# Patient Record
Sex: Male | Born: 1950 | Race: White | Hispanic: No | Smoking: Current every day smoker
Health system: Southern US, Community
[De-identification: ages and names within clinical notes are randomized; demographics above are authoritative.]

## PROBLEM LIST (undated history)

## (undated) DIAGNOSIS — C801 Malignant (primary) neoplasm, unspecified: Secondary | ICD-10-CM

---

## 1997-12-21 ENCOUNTER — Encounter: Admission: RE | Admit: 1997-12-21 | Discharge: 1997-12-21 | Payer: Self-pay | Admitting: Internal Medicine

## 1997-12-22 ENCOUNTER — Encounter: Admission: RE | Admit: 1997-12-22 | Discharge: 1997-12-22 | Payer: Self-pay | Admitting: Internal Medicine

## 1997-12-29 ENCOUNTER — Encounter: Admission: RE | Admit: 1997-12-29 | Discharge: 1997-12-29 | Payer: Self-pay | Admitting: Hematology and Oncology

## 1998-01-23 ENCOUNTER — Ambulatory Visit (HOSPITAL_COMMUNITY): Admission: RE | Admit: 1998-01-23 | Discharge: 1998-01-23 | Payer: Self-pay | Admitting: *Deleted

## 2000-04-06 ENCOUNTER — Emergency Department (HOSPITAL_COMMUNITY): Admission: EM | Admit: 2000-04-06 | Discharge: 2000-04-06 | Payer: Self-pay | Admitting: Emergency Medicine

## 2000-04-06 ENCOUNTER — Encounter: Payer: Self-pay | Admitting: Emergency Medicine

## 2000-08-18 ENCOUNTER — Emergency Department (HOSPITAL_COMMUNITY): Admission: EM | Admit: 2000-08-18 | Discharge: 2000-08-18 | Payer: Self-pay | Admitting: Emergency Medicine

## 2002-07-06 ENCOUNTER — Emergency Department (HOSPITAL_COMMUNITY): Admission: EM | Admit: 2002-07-06 | Discharge: 2002-07-06 | Payer: Self-pay | Admitting: Emergency Medicine

## 2002-07-29 ENCOUNTER — Encounter: Admission: RE | Admit: 2002-07-29 | Discharge: 2002-07-29 | Payer: Self-pay | Admitting: Family Medicine

## 2002-07-29 ENCOUNTER — Encounter: Payer: Self-pay | Admitting: Family Medicine

## 2002-08-23 ENCOUNTER — Encounter: Admission: RE | Admit: 2002-08-23 | Discharge: 2002-08-23 | Payer: Self-pay | Admitting: Family Medicine

## 2002-08-31 ENCOUNTER — Encounter: Admission: RE | Admit: 2002-08-31 | Discharge: 2002-09-24 | Payer: Self-pay | Admitting: Family Medicine

## 2002-09-20 ENCOUNTER — Encounter: Admission: RE | Admit: 2002-09-20 | Discharge: 2002-09-20 | Payer: Self-pay | Admitting: Family Medicine

## 2002-10-08 ENCOUNTER — Encounter: Payer: Self-pay | Admitting: Orthopedic Surgery

## 2002-10-12 ENCOUNTER — Ambulatory Visit (HOSPITAL_COMMUNITY): Admission: RE | Admit: 2002-10-12 | Discharge: 2002-10-12 | Payer: Self-pay | Admitting: Orthopedic Surgery

## 2002-10-12 ENCOUNTER — Encounter: Payer: Self-pay | Admitting: Orthopedic Surgery

## 2002-10-15 ENCOUNTER — Encounter: Admission: RE | Admit: 2002-10-15 | Discharge: 2002-12-03 | Payer: Self-pay | Admitting: Orthopedic Surgery

## 2003-04-22 ENCOUNTER — Encounter: Admission: RE | Admit: 2003-04-22 | Discharge: 2003-04-22 | Payer: Self-pay | Admitting: Family Medicine

## 2003-09-04 ENCOUNTER — Inpatient Hospital Stay (HOSPITAL_COMMUNITY): Admission: AD | Admit: 2003-09-04 | Discharge: 2003-09-07 | Payer: Self-pay | Admitting: Family Medicine

## 2003-12-31 ENCOUNTER — Emergency Department (HOSPITAL_COMMUNITY): Admission: AD | Admit: 2003-12-31 | Discharge: 2003-12-31 | Payer: Self-pay | Admitting: *Deleted

## 2004-01-24 ENCOUNTER — Encounter: Admission: RE | Admit: 2004-01-24 | Discharge: 2004-01-24 | Payer: Self-pay | Admitting: Family Medicine

## 2004-01-25 ENCOUNTER — Encounter: Admission: RE | Admit: 2004-01-25 | Discharge: 2004-01-25 | Payer: Self-pay | Admitting: Sports Medicine

## 2004-01-26 ENCOUNTER — Encounter: Admission: RE | Admit: 2004-01-26 | Discharge: 2004-01-26 | Payer: Self-pay | Admitting: Family Medicine

## 2004-01-30 ENCOUNTER — Encounter: Admission: RE | Admit: 2004-01-30 | Discharge: 2004-01-30 | Payer: Self-pay | Admitting: Family Medicine

## 2004-02-01 ENCOUNTER — Ambulatory Visit (HOSPITAL_COMMUNITY): Admission: RE | Admit: 2004-02-01 | Discharge: 2004-02-01 | Payer: Self-pay | Admitting: Urology

## 2004-02-06 ENCOUNTER — Emergency Department (HOSPITAL_COMMUNITY): Admission: AC | Admit: 2004-02-06 | Discharge: 2004-02-07 | Payer: Self-pay

## 2004-02-21 ENCOUNTER — Encounter (INDEPENDENT_AMBULATORY_CARE_PROVIDER_SITE_OTHER): Payer: Self-pay | Admitting: *Deleted

## 2004-02-21 ENCOUNTER — Inpatient Hospital Stay (HOSPITAL_COMMUNITY): Admission: RE | Admit: 2004-02-21 | Discharge: 2004-02-24 | Payer: Self-pay | Admitting: Urology

## 2004-04-02 ENCOUNTER — Ambulatory Visit: Payer: Self-pay | Admitting: Family Medicine

## 2004-04-10 ENCOUNTER — Ambulatory Visit: Payer: Self-pay | Admitting: Family Medicine

## 2004-04-30 ENCOUNTER — Ambulatory Visit: Payer: Self-pay | Admitting: Family Medicine

## 2004-05-18 ENCOUNTER — Encounter: Admission: RE | Admit: 2004-05-18 | Discharge: 2004-05-18 | Payer: Self-pay | Admitting: Urology

## 2004-06-28 ENCOUNTER — Emergency Department (HOSPITAL_COMMUNITY): Admission: EM | Admit: 2004-06-28 | Discharge: 2004-06-28 | Payer: Self-pay | Admitting: Family Medicine

## 2004-08-02 ENCOUNTER — Emergency Department (HOSPITAL_COMMUNITY): Admission: EM | Admit: 2004-08-02 | Discharge: 2004-08-02 | Payer: Self-pay

## 2004-08-06 ENCOUNTER — Ambulatory Visit: Payer: Self-pay | Admitting: Family Medicine

## 2004-08-07 ENCOUNTER — Encounter: Admission: RE | Admit: 2004-08-07 | Discharge: 2004-08-07 | Payer: Self-pay | Admitting: Family Medicine

## 2004-08-10 ENCOUNTER — Ambulatory Visit: Payer: Self-pay | Admitting: Family Medicine

## 2004-11-19 ENCOUNTER — Ambulatory Visit (HOSPITAL_COMMUNITY): Admission: RE | Admit: 2004-11-19 | Discharge: 2004-11-19 | Payer: Self-pay | Admitting: Urology

## 2004-11-22 ENCOUNTER — Ambulatory Visit (HOSPITAL_COMMUNITY): Admission: RE | Admit: 2004-11-22 | Discharge: 2004-11-22 | Payer: Self-pay | Admitting: Urology

## 2004-12-14 ENCOUNTER — Emergency Department (HOSPITAL_COMMUNITY): Admission: EM | Admit: 2004-12-14 | Discharge: 2004-12-14 | Payer: Self-pay | Admitting: Family Medicine

## 2005-01-14 ENCOUNTER — Encounter (INDEPENDENT_AMBULATORY_CARE_PROVIDER_SITE_OTHER): Payer: Self-pay | Admitting: *Deleted

## 2005-01-14 ENCOUNTER — Ambulatory Visit (HOSPITAL_COMMUNITY): Admission: RE | Admit: 2005-01-14 | Discharge: 2005-01-15 | Payer: Self-pay | Admitting: General Surgery

## 2005-02-27 IMAGING — CR DG CHEST 2V
2 series · 2 of 2 positions shown · non-contrast
Comparison: none

CLINICAL DATA: History of renal cell cancer.  Smoker. Cough.
 CHEST, TWO VIEWS ? 05/18/04:
 Since [REDACTED] two view chest x-ray of 01/30/04, probable prominent nipple shadows are seen on PA view.  Repeat PA view with nipple shadows is advised for further evaluation.  Stable surgical sutures are seen at the medial right lung apex with the lungs otherwise clear.  Heart size is normal.  Mediastinum, hila, pleura, and osseous structures are stable.

[view not recorded (1 of 2)]
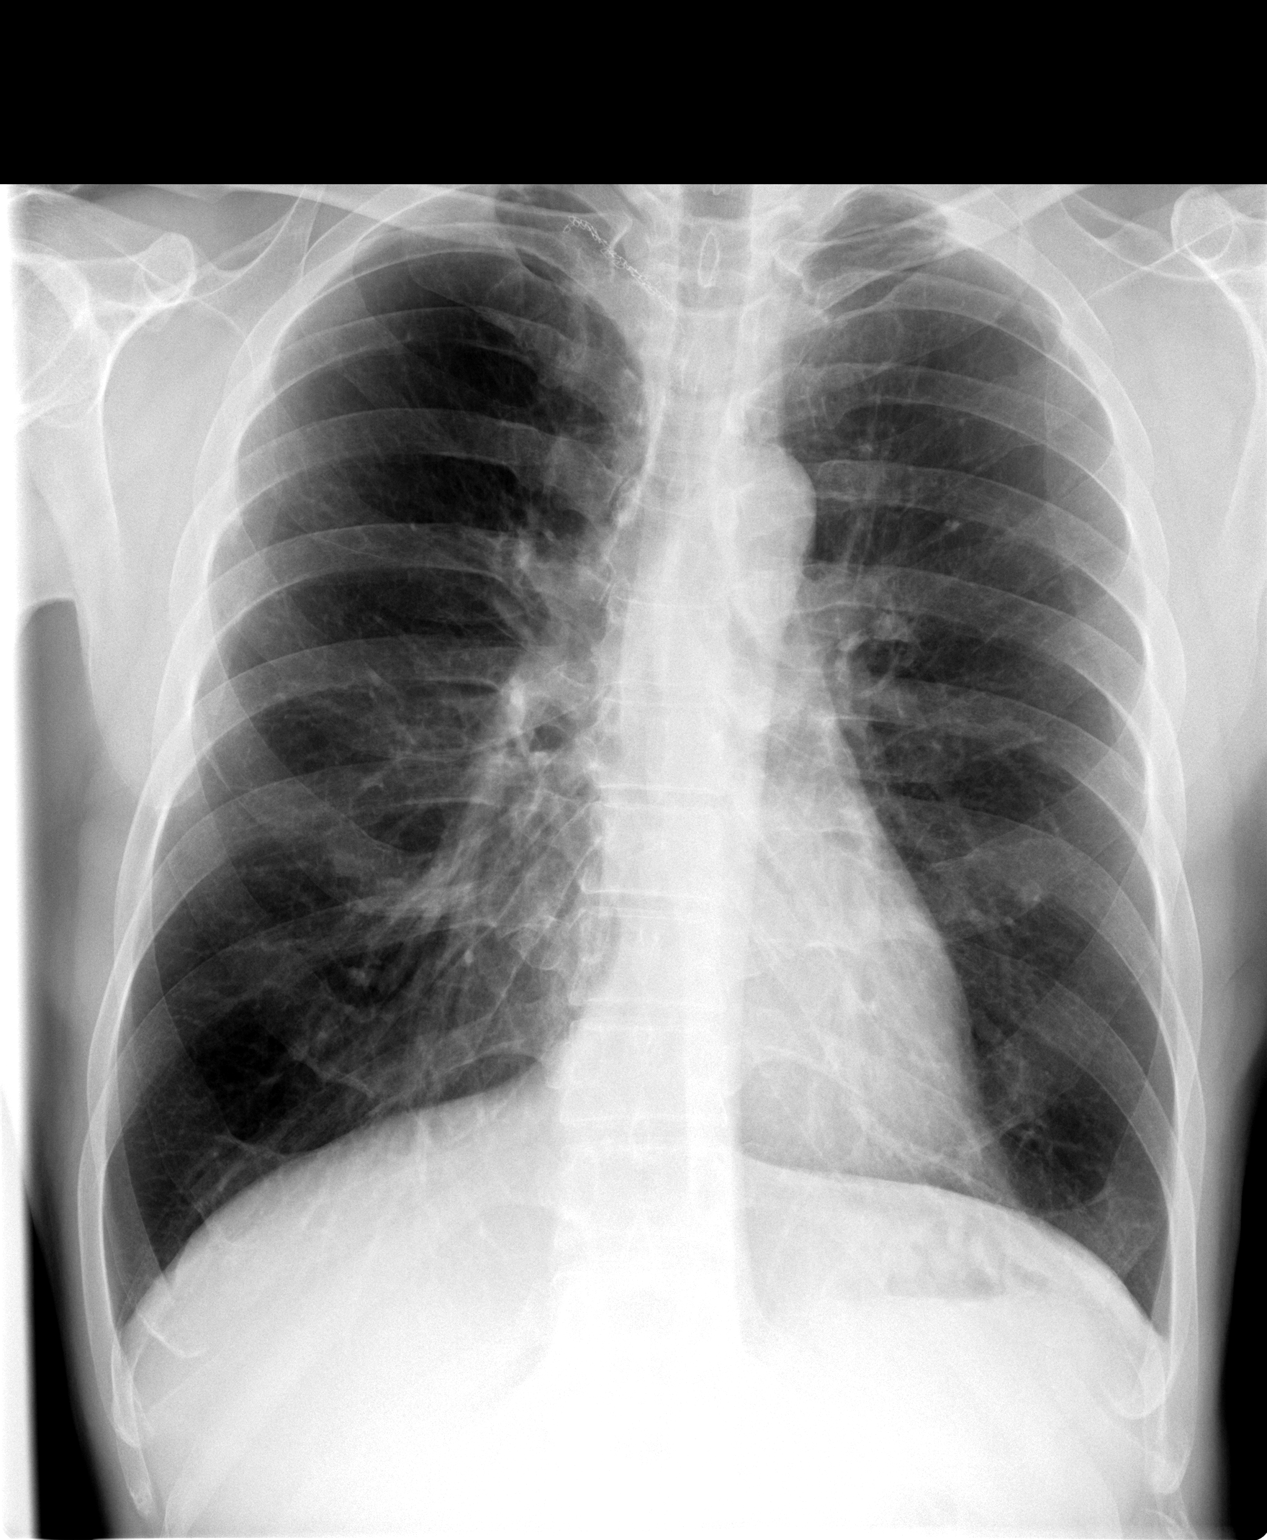

[view not recorded (2 of 2)]
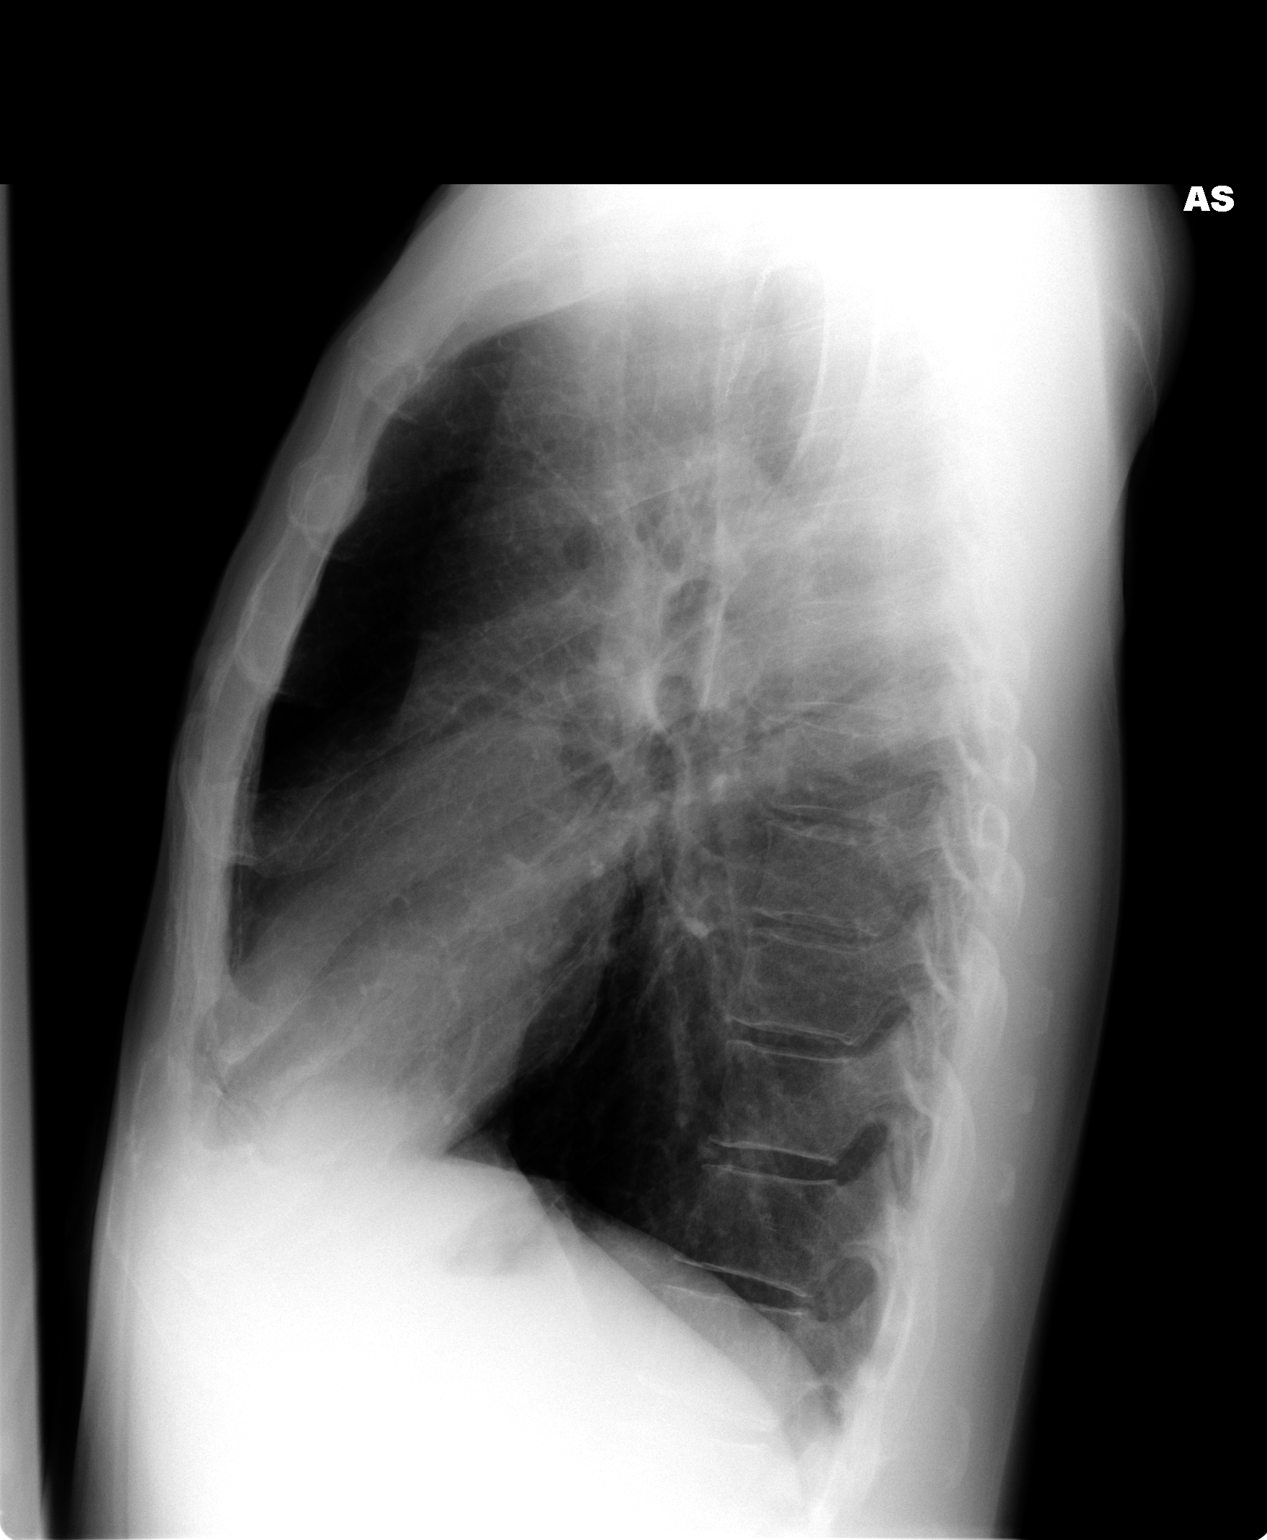

[2 of 2 positions shown; findings below may reference images not displayed]

IMPRESSION: Since 01/30/04:  
 [DATE].  Probable bilateral lateral prominent nipple shadows on PA view ? recommend repeat PA with nipple markers.
 2.  Otherwise no active disease nor metastatic disease.

## 2005-07-04 ENCOUNTER — Ambulatory Visit: Payer: Self-pay | Admitting: Sports Medicine

## 2005-07-16 ENCOUNTER — Encounter: Admission: RE | Admit: 2005-07-16 | Discharge: 2005-07-16 | Payer: Self-pay | Admitting: Family Medicine

## 2005-08-12 ENCOUNTER — Ambulatory Visit: Payer: Self-pay | Admitting: Family Medicine

## 2005-10-14 ENCOUNTER — Emergency Department (HOSPITAL_COMMUNITY): Admission: EM | Admit: 2005-10-14 | Discharge: 2005-10-15 | Payer: Self-pay | Admitting: Emergency Medicine

## 2005-10-18 ENCOUNTER — Ambulatory Visit: Payer: Self-pay | Admitting: Family Medicine

## 2005-10-25 ENCOUNTER — Ambulatory Visit: Payer: Self-pay | Admitting: Family Medicine

## 2005-11-11 ENCOUNTER — Ambulatory Visit: Payer: Self-pay | Admitting: Family Medicine

## 2005-11-22 ENCOUNTER — Ambulatory Visit: Payer: Self-pay | Admitting: Family Medicine

## 2006-01-01 ENCOUNTER — Ambulatory Visit: Payer: Self-pay | Admitting: Family Medicine

## 2006-01-20 ENCOUNTER — Ambulatory Visit: Payer: Self-pay | Admitting: Family Medicine

## 2006-01-30 ENCOUNTER — Ambulatory Visit: Payer: Self-pay | Admitting: Sports Medicine

## 2006-02-16 ENCOUNTER — Emergency Department (HOSPITAL_COMMUNITY): Admission: EM | Admit: 2006-02-16 | Discharge: 2006-02-16 | Payer: Self-pay | Admitting: Emergency Medicine

## 2006-02-27 ENCOUNTER — Ambulatory Visit: Payer: Self-pay | Admitting: Family Medicine

## 2006-03-05 ENCOUNTER — Ambulatory Visit: Payer: Self-pay | Admitting: Family Medicine

## 2006-04-17 ENCOUNTER — Ambulatory Visit: Payer: Self-pay | Admitting: Family Medicine

## 2006-06-04 ENCOUNTER — Ambulatory Visit: Payer: Self-pay | Admitting: Family Medicine

## 2006-08-01 ENCOUNTER — Ambulatory Visit: Payer: Self-pay | Admitting: Family Medicine

## 2006-08-05 ENCOUNTER — Ambulatory Visit: Payer: Self-pay | Admitting: Family Medicine

## 2006-08-11 ENCOUNTER — Ambulatory Visit: Payer: Self-pay | Admitting: Sports Medicine

## 2006-09-04 DIAGNOSIS — I10 Essential (primary) hypertension: Secondary | ICD-10-CM | POA: Insufficient documentation

## 2006-09-04 DIAGNOSIS — M5382 Other specified dorsopathies, cervical region: Secondary | ICD-10-CM | POA: Insufficient documentation

## 2006-09-04 DIAGNOSIS — F319 Bipolar disorder, unspecified: Secondary | ICD-10-CM | POA: Insufficient documentation

## 2006-11-26 ENCOUNTER — Ambulatory Visit: Payer: Self-pay | Admitting: *Deleted

## 2006-11-26 ENCOUNTER — Telehealth: Payer: Self-pay | Admitting: *Deleted

## 2006-11-26 ENCOUNTER — Encounter: Payer: Self-pay | Admitting: Family Medicine

## 2006-11-26 ENCOUNTER — Ambulatory Visit (HOSPITAL_COMMUNITY): Admission: RE | Admit: 2006-11-26 | Discharge: 2006-11-26 | Payer: Self-pay | Admitting: Family Medicine

## 2006-11-26 LAB — CONVERTED CEMR LAB
ALT: 18 units/L (ref 0–53)
AST: 18 units/L (ref 0–37)
Albumin: 4.9 g/dL (ref 3.5–5.2)
CO2: 30 meq/L (ref 19–32)
Calcium: 10.2 mg/dL (ref 8.4–10.5)
Chloride: 106 meq/L (ref 96–112)
Glucose, Bld: 55 mg/dL — ABNORMAL LOW (ref 70–99)
Sodium: 142 meq/L (ref 135–145)
Total Bilirubin: 0.5 mg/dL (ref 0.3–1.2)
Total Protein: 7.3 g/dL (ref 6.0–8.3)

## 2006-11-27 ENCOUNTER — Ambulatory Visit: Payer: Self-pay | Admitting: Family Medicine

## 2006-11-28 ENCOUNTER — Encounter: Payer: Self-pay | Admitting: Family Medicine

## 2007-01-26 ENCOUNTER — Inpatient Hospital Stay (HOSPITAL_COMMUNITY): Admission: RE | Admit: 2007-01-26 | Discharge: 2007-02-02 | Payer: Self-pay | Admitting: Psychiatry

## 2007-01-26 ENCOUNTER — Ambulatory Visit: Payer: Self-pay | Admitting: Psychiatry

## 2007-01-26 ENCOUNTER — Emergency Department (HOSPITAL_COMMUNITY): Admission: EM | Admit: 2007-01-26 | Discharge: 2007-01-26 | Payer: Self-pay | Admitting: Emergency Medicine

## 2007-03-10 ENCOUNTER — Telehealth: Payer: Self-pay | Admitting: *Deleted

## 2007-03-11 ENCOUNTER — Encounter: Payer: Self-pay | Admitting: *Deleted

## 2007-03-11 ENCOUNTER — Telehealth (INDEPENDENT_AMBULATORY_CARE_PROVIDER_SITE_OTHER): Payer: Self-pay | Admitting: *Deleted

## 2007-03-16 ENCOUNTER — Ambulatory Visit: Payer: Self-pay | Admitting: Family Medicine

## 2007-03-16 DIAGNOSIS — Z85528 Personal history of other malignant neoplasm of kidney: Secondary | ICD-10-CM | POA: Insufficient documentation

## 2007-03-16 LAB — CONVERTED CEMR LAB
ALT: 27 units/L (ref 0–53)
Albumin: 4.8 g/dL (ref 3.5–5.2)
CO2: 23 meq/L (ref 19–32)
Glucose, Bld: 79 mg/dL (ref 70–99)
Potassium: 4.4 meq/L (ref 3.5–5.3)
Total Protein: 7.5 g/dL (ref 6.0–8.3)

## 2007-03-19 ENCOUNTER — Encounter: Admission: RE | Admit: 2007-03-19 | Discharge: 2007-03-19 | Payer: Self-pay | Admitting: Family Medicine

## 2007-03-19 ENCOUNTER — Telehealth (INDEPENDENT_AMBULATORY_CARE_PROVIDER_SITE_OTHER): Payer: Self-pay | Admitting: Family Medicine

## 2007-03-20 ENCOUNTER — Telehealth (INDEPENDENT_AMBULATORY_CARE_PROVIDER_SITE_OTHER): Payer: Self-pay | Admitting: *Deleted

## 2007-03-23 ENCOUNTER — Encounter: Admission: RE | Admit: 2007-03-23 | Discharge: 2007-03-23 | Payer: Self-pay | Admitting: Family Medicine

## 2007-03-27 ENCOUNTER — Telehealth: Payer: Self-pay | Admitting: Family Medicine

## 2007-04-29 ENCOUNTER — Ambulatory Visit: Payer: Self-pay | Admitting: Family Medicine

## 2007-04-29 ENCOUNTER — Telehealth (INDEPENDENT_AMBULATORY_CARE_PROVIDER_SITE_OTHER): Payer: Self-pay | Admitting: *Deleted

## 2007-06-11 ENCOUNTER — Encounter (INDEPENDENT_AMBULATORY_CARE_PROVIDER_SITE_OTHER): Payer: Self-pay | Admitting: *Deleted

## 2007-06-15 ENCOUNTER — Telehealth: Payer: Self-pay | Admitting: *Deleted

## 2007-06-16 ENCOUNTER — Encounter (INDEPENDENT_AMBULATORY_CARE_PROVIDER_SITE_OTHER): Payer: Self-pay | Admitting: *Deleted

## 2007-06-17 ENCOUNTER — Encounter (INDEPENDENT_AMBULATORY_CARE_PROVIDER_SITE_OTHER): Payer: Self-pay | Admitting: *Deleted

## 2007-07-13 ENCOUNTER — Telehealth: Payer: Self-pay | Admitting: Family Medicine

## 2007-08-03 ENCOUNTER — Ambulatory Visit: Payer: Self-pay | Admitting: Family Medicine

## 2007-08-03 ENCOUNTER — Telehealth (INDEPENDENT_AMBULATORY_CARE_PROVIDER_SITE_OTHER): Payer: Self-pay | Admitting: *Deleted

## 2007-08-03 DIAGNOSIS — J439 Emphysema, unspecified: Secondary | ICD-10-CM | POA: Insufficient documentation

## 2007-08-27 ENCOUNTER — Ambulatory Visit: Payer: Self-pay | Admitting: Family Medicine

## 2007-08-27 ENCOUNTER — Telehealth: Payer: Self-pay | Admitting: *Deleted

## 2007-08-28 ENCOUNTER — Telehealth: Payer: Self-pay | Admitting: *Deleted

## 2007-08-31 ENCOUNTER — Telehealth: Payer: Self-pay | Admitting: Family Medicine

## 2008-01-12 ENCOUNTER — Ambulatory Visit: Payer: Self-pay | Admitting: Family Medicine

## 2008-03-02 ENCOUNTER — Telehealth (INDEPENDENT_AMBULATORY_CARE_PROVIDER_SITE_OTHER): Payer: Self-pay | Admitting: Family Medicine

## 2008-03-02 ENCOUNTER — Ambulatory Visit: Payer: Self-pay | Admitting: Family Medicine

## 2008-03-02 ENCOUNTER — Encounter: Admission: RE | Admit: 2008-03-02 | Discharge: 2008-03-02 | Payer: Self-pay | Admitting: Family Medicine

## 2008-03-02 ENCOUNTER — Telehealth: Payer: Self-pay | Admitting: *Deleted

## 2008-03-02 LAB — CONVERTED CEMR LAB
Blood in Urine, dipstick: NEGATIVE
Glucose, Urine, Semiquant: NEGATIVE
Ketones, urine, test strip: NEGATIVE

## 2008-03-08 ENCOUNTER — Encounter: Payer: Self-pay | Admitting: Family Medicine

## 2008-03-21 ENCOUNTER — Encounter: Payer: Self-pay | Admitting: *Deleted

## 2008-05-12 ENCOUNTER — Encounter: Payer: Self-pay | Admitting: *Deleted

## 2008-05-18 ENCOUNTER — Ambulatory Visit: Payer: Self-pay | Admitting: Family Medicine

## 2008-05-18 LAB — CONVERTED CEMR LAB
Bilirubin Urine: NEGATIVE
Blood in Urine, dipstick: NEGATIVE
HDL: 48 mg/dL (ref >39)
LDL Cholesterol: 85 mg/dL (ref 0–99)
Nitrite: NEGATIVE
PSA: 0.96 ng/mL (ref 0.10–4.00)
Protein, U semiquant: NEGATIVE
Specific Gravity, Urine: 1.025
Total CHOL/HDL Ratio: 3.1
Triglycerides: 69 mg/dL (ref <150)
Urobilinogen, UA: 0.2

## 2008-06-01 ENCOUNTER — Encounter (INDEPENDENT_AMBULATORY_CARE_PROVIDER_SITE_OTHER): Payer: Self-pay | Admitting: *Deleted

## 2008-07-21 ENCOUNTER — Telehealth: Payer: Self-pay | Admitting: Family Medicine

## 2008-08-19 ENCOUNTER — Telehealth: Payer: Self-pay | Admitting: Family Medicine

## 2008-10-17 ENCOUNTER — Telehealth (INDEPENDENT_AMBULATORY_CARE_PROVIDER_SITE_OTHER): Payer: Self-pay | Admitting: *Deleted

## 2008-10-26 ENCOUNTER — Telehealth: Payer: Self-pay | Admitting: Family Medicine

## 2009-01-24 ENCOUNTER — Telehealth: Payer: Self-pay | Admitting: Family Medicine

## 2009-03-25 ENCOUNTER — Encounter (INDEPENDENT_AMBULATORY_CARE_PROVIDER_SITE_OTHER): Payer: Self-pay | Admitting: *Deleted

## 2009-03-25 DIAGNOSIS — F17200 Nicotine dependence, unspecified, uncomplicated: Secondary | ICD-10-CM

## 2009-04-20 ENCOUNTER — Encounter (INDEPENDENT_AMBULATORY_CARE_PROVIDER_SITE_OTHER): Payer: Self-pay

## 2009-04-26 ENCOUNTER — Ambulatory Visit: Payer: Self-pay | Admitting: Family Medicine

## 2009-04-26 LAB — CONVERTED CEMR LAB
CO2: 26 meq/L (ref 19–32)
Calcium: 9.8 mg/dL (ref 8.4–10.5)
Chloride: 105 meq/L (ref 96–112)
Potassium: 4.4 meq/L (ref 3.5–5.3)
Sodium: 141 meq/L (ref 135–145)

## 2009-08-04 ENCOUNTER — Ambulatory Visit: Payer: Self-pay | Admitting: Family Medicine

## 2009-08-04 DIAGNOSIS — F410 Panic disorder [episodic paroxysmal anxiety] without agoraphobia: Secondary | ICD-10-CM

## 2009-08-07 ENCOUNTER — Telehealth: Payer: Self-pay | Admitting: *Deleted

## 2009-09-14 ENCOUNTER — Telehealth: Payer: Self-pay | Admitting: Family Medicine

## 2009-09-20 ENCOUNTER — Ambulatory Visit (HOSPITAL_COMMUNITY): Admission: RE | Admit: 2009-09-20 | Discharge: 2009-09-20 | Payer: Self-pay | Admitting: Family Medicine

## 2009-09-20 ENCOUNTER — Ambulatory Visit: Payer: Self-pay | Admitting: Family Medicine

## 2009-09-20 DIAGNOSIS — R55 Syncope and collapse: Secondary | ICD-10-CM

## 2009-09-20 DIAGNOSIS — N200 Calculus of kidney: Secondary | ICD-10-CM | POA: Insufficient documentation

## 2009-09-20 LAB — CONVERTED CEMR LAB
Blood in Urine, dipstick: NEGATIVE
Creatinine, Ser: 1.31 mg/dL (ref 0.40–1.50)
Glucose, Bld: 49 mg/dL — ABNORMAL LOW (ref 70–99)
MCHC: 34.7 g/dL (ref 30.0–36.0)
MCV: 94.1 fL (ref 78.0–100.0)
Nitrite: NEGATIVE
RBC: 4.9 M/uL (ref 4.22–5.81)
RDW: 12.9 % (ref 11.5–15.5)
Specific Gravity, Urine: 1.02
WBC Urine, dipstick: NEGATIVE
WBC: 6 10*3/uL (ref 4.0–10.5)
pH: 6.5

## 2009-09-25 ENCOUNTER — Encounter: Payer: Self-pay | Admitting: Family Medicine

## 2009-09-27 ENCOUNTER — Telehealth: Payer: Self-pay | Admitting: Family Medicine

## 2009-09-28 ENCOUNTER — Encounter: Payer: Self-pay | Admitting: Family Medicine

## 2009-09-28 ENCOUNTER — Ambulatory Visit (HOSPITAL_COMMUNITY): Admission: RE | Admit: 2009-09-28 | Discharge: 2009-09-28 | Payer: Self-pay | Admitting: Family Medicine

## 2009-09-29 ENCOUNTER — Telehealth: Payer: Self-pay | Admitting: Family Medicine

## 2009-10-11 ENCOUNTER — Ambulatory Visit: Payer: Self-pay | Admitting: Family Medicine

## 2009-11-10 ENCOUNTER — Ambulatory Visit: Payer: Self-pay

## 2009-11-10 DIAGNOSIS — N390 Urinary tract infection, site not specified: Secondary | ICD-10-CM

## 2009-11-10 LAB — CONVERTED CEMR LAB
Blood in Urine, dipstick: NEGATIVE
Glucose, Urine, Semiquant: NEGATIVE
Protein, U semiquant: NEGATIVE
Specific Gravity, Urine: 1.015
Urobilinogen, UA: 0.2
WBC Urine, dipstick: NEGATIVE
pH: 7

## 2010-03-20 ENCOUNTER — Encounter: Payer: Self-pay | Admitting: Family Medicine

## 2010-04-24 ENCOUNTER — Encounter: Payer: Self-pay | Admitting: Family Medicine

## 2010-05-04 ENCOUNTER — Ambulatory Visit (HOSPITAL_COMMUNITY): Admission: RE | Admit: 2010-05-04 | Discharge: 2010-05-04 | Payer: Self-pay | Admitting: Urology

## 2010-05-08 ENCOUNTER — Encounter: Payer: Self-pay | Admitting: Family Medicine

## 2010-06-13 ENCOUNTER — Ambulatory Visit: Payer: Self-pay | Admitting: Family Medicine

## 2010-06-14 ENCOUNTER — Telehealth: Payer: Self-pay | Admitting: Family Medicine

## 2010-07-28 ENCOUNTER — Encounter: Payer: Self-pay | Admitting: Family Medicine

## 2010-08-07 NOTE — Progress Notes (Signed)
Summary: test results  Phone Note Call from Patient Call back at Home Phone [phone]   Caller: Patient Summary of Call: pt wants to know results of test yesterday Initial call taken by: De Nurse,  September 29, 2009 11:25 AM  Follow-up for Phone Call        told him the dr will call him & discuss results Follow-up by: Golden Circle RN,  September 29, 2009 11:27 AM  Additional Follow-up for Phone Call Additional follow up Details #1::        Told echo normal.  No recent syncope.  Will observe for now.  He will call if syncope recurs. Additional Follow-up by: Doralee Albino MD,  September 29, 2009 11:55 AM      Echocardiogram  Procedure date:  09/28/2009  Findings:      normal:   Ejection Fraction:  >=50%.     Echocardiogram  Procedure date:  09/28/2009  Findings:      normal:   Ejection Fraction:  >=50%.

## 2010-08-07 NOTE — Consult Note (Signed)
[facility]: [facility]   Imported By: De Nurse 05/03/2010 12:05:50  _____________________________________________________________________  External Attachment:    Type:   Image     Comment:   External Document

## 2010-08-07 NOTE — Progress Notes (Signed)
Summary: refill  Phone Note Refill Request Call back at Home Phone [phone] Message from:  Patient  Refills Requested: Medication #1:  SEROQUEL 200 MG  TABS one three times a day   Notes: wants to know if dosage can be increased - [facility]  Initial call taken by: De Nurse,  September 14, 2009 11:01 AM  Follow-up for Phone Call        Called and did increase dose to 400 two times a day  Follow-up by: Doralee Albino MD,  September 14, 2009 11:40 AM    New/Updated Medications: SEROQUEL 400 MG TABS (QUETIAPINE FUMARATE) one by mouth two times a day

## 2010-08-07 NOTE — Consult Note (Signed)
Summary: [facility]   Imported By: De Nurse 05/14/2010 15:38:56  _____________________________________________________________________  External Attachment:    Type:   Image     Comment:   External Document

## 2010-08-07 NOTE — Assessment & Plan Note (Signed)
Summary: cough,df   Vital Signs:  Patient profile:   60 year old male Height:      67 inches Weight:      172.6 pounds BMI:     27.13 Temp:     98.2 degrees F oral Pulse rate:   86 / minute BP sitting:   128 / 77  (left arm) Cuff size:   regular  Vitals Entered By: Garen Grams LPN (June 13, 2010 2:32 PM) CC: cough, headache, body aches x 1 week Is Patient Diabetic? No Pain Assessment Patient in pain? yes     Location: body aches   Primary Care Provider:  Doralee Albino MD  CC:  cough, headache, and body aches x 1 week.  History of Present Illness: 1) Cough: H/O COPD. Reports somewhat productive (unable to clear sputum at times) cough x 1 week. Reports chest soreness and headache "from coughing so much". Reports some dyspnea as well worse at night. Does not want to be on daily controller medication.   Denies wheezing, hemoptysis, fever, chills, nausea, vomiting or diarrhea, sick contact.  2) Tobacco use: Precontemplative about quitting. Smokes one pack a day. "Too stubborn to quit".     Habits & Providers  Alcohol-Tobacco-Diet     Tobacco Status: current     Tobacco Counseling: to quit use of tobacco products     Cigarette Packs/Day: 1.0  Allergies: 1)  Codeine  Social History: Packs/Day:  1.0  Physical Exam  General:  pleasant, NAD, vitals reviewed, smells like cigarette smoke  Eyes:  no conjunctivitis  Nose:  no congestion  Mouth:  moist membranes w/o erythema  Neck:  no lymphadenopathy   Chest Wall:  tender to palpation over entire anterior chest wall  Lungs:  Normal effort, mild occasional end expiratory wheeze, coughing with deep breathing  Heart:  RRR, no murmurs  Extremities:  no edema    Impression & Recommendations:  Problem # 1:  COPD (ICD-496) Assessment Deteriorated  Will treat for acute exacerbation with doxycycline, albuterol HFA as needed, prednisone. Would consider controller medication depending on symptom frequency and/or prior  PFTs (however patient does not seem amenable to this). Follow up with PCP as needed. Reviewed red flags that would prompt return to care.   His updated medication list for this problem includes:    Ventolin Hfa 108 (90 Base) Mcg/act Aers (Albuterol sulfate) .Marland Kitchen..Marland Kitchen Two puffs inhaled q4 hrs as needed for short of breath or wheezing  Orders: [facility]- Est Level  3 ([address])  Complete Medication List: 1)  Seroquel 400 Mg Tabs (Quetiapine fumarate) .... One by mouth two times a day 2)  Buspirone Hcl 5 Mg Tabs (Buspirone hcl) .... One by mouth each morning 3)  Acetaminophen 500 Mg Tabs (Acetaminophen) .... One tab by mouth q 4 hrs as needed for pain 4)  Doxycycline Hyclate 100 Mg Tabs (Doxycycline hyclate) .... One tab by mouth two times a day x 7 days 5)  Prednisone 50 Mg Tabs (Prednisone) .... One tab by mouth qday x 7 days 6)  Ventolin Hfa 108 (90 Base) Mcg/act Aers (Albuterol sulfate) .... Two puffs inhaled q4 hrs as needed for short of breath or wheezing  Patient Instructions: 1)  Follow up with Dr. Leveda Anna as scheduled.  2)  If you are getting worse come back in to be seen. 3)  We need to get you to quit smoking - talk to Dr. Leveda Anna more about this as well.  4)  Use the inhaler for when  you feel short of breath as instructed Prescriptions: VENTOLIN HFA 108 (90 BASE) MCG/ACT AERS (ALBUTEROL SULFATE) two puffs inhaled q4 hrs as needed for short of breath or wheezing  #1 x 1   Entered and Authorized by:   Bobby Rumpf  MD   Signed by:   Bobby Rumpf  MD on 06/13/2010   Method used:   Electronically to        [facility] [address] [phone]* (retail)       [address]       [place], [place]  [address]       Ph: [phone] or [phone]       Fax: [phone]   RxID:   [id number] PREDNISONE 50 MG TABS (PREDNISONE) one tab by mouth qday x 7 days  #7 x 0   Entered and Authorized by:   Bobby Rumpf  MD   Signed by:   Bobby Rumpf  MD on 06/13/2010   Method used:    Electronically to        [facility] [address] [phone]* (retail)       [address]       [place], [place]  [address]       Ph: [phone] or [phone]       Fax: [phone]   RxID:   [id number] DOXYCYCLINE HYCLATE 100 MG TABS (DOXYCYCLINE HYCLATE) one tab by mouth two times a day x 7 days  #14 x 0   Entered and Authorized by:   Bobby Rumpf  MD   Signed by:   Bobby Rumpf  MD on 06/13/2010   Method used:   Electronically to        [facility] [address] [phone]* (retail)       [address]       [place], [place]  [address]       Ph: [phone] or [phone]       Fax: [phone]   RxID:   [id number] ACETAMINOPHEN 500 MG TABS (ACETAMINOPHEN) one tab by mouth q 4 hrs as needed for pain  #30 x 0   Entered and Authorized by:   Bobby Rumpf  MD   Signed by:   Bobby Rumpf  MD on 06/13/2010   Method used:   Electronically to        [facility] [address] [phone]* (retail)       [address]       [place], [place]  [address]       Ph: [phone] or [phone]       Fax: [phone]   RxID:   [id number]    Orders Added: 1)  [facility]- Est Level  3 [[address]]

## 2010-08-07 NOTE — Assessment & Plan Note (Signed)
Summary: f/up from urgent care visit,tcb   Vital Signs:  Patient profile:   60 year old male Height:      67 inches Weight:      148.2 pounds BMI:     23.30 Temp:     97.7 degrees F oral Pulse rate:   67 / minute BP sitting:   142 / 80  (left arm) Cuff size:   regular  Vitals Entered By: Gladstone Pih (Nov 10, 2009 3:55 PM) CC: F/U UC--fell and UTI Is Patient Diabetic? No Pain Assessment Patient in pain? no        Primary Care Provider:  Doralee Albino MD  CC:  F/U UC--fell and UTI.  History of Present Illness: Dramatically decreased tobacco from 2 ppd to 3-4 cigarettes daily No more syncope. Was seen in Urgent Care for fall.  Was told he had a UTI.  Was given placed on antibiotics - now finished.  His big concern is that his renal cancer was diagnosed after a previous "UTI"  He worries the cancer may be back.   Habits & Providers  Alcohol-Tobacco-Diet     Tobacco Status: current     Tobacco Counseling: to quit use of tobacco products     Cigarette Packs/Day: 0.5  Current Medications (verified): 1)  Seroquel 400 Mg Tabs (Quetiapine Fumarate) .... One By Mouth Two Times A Day 2)  Buspar 5 Mg Tabs (Buspirone Hcl) .... One By Mouth Daily Per Mental Health 3)  Hydrocodone-Acetaminophen 5-500 Mg Tabs (Hydrocodone-Acetaminophen) .... One By Mouth Q6h As Needed Pain  Allergies (verified): 1)  Codeine  Past History:  Past medical, surgical, family and social histories (including risk factors) reviewed, and no changes noted (except as noted below).  Past Medical History: Reviewed history from 09/04/2006 and no changes required. bilateral frozen shoulder, psych meds per mental health, Renal cancer 7/05 Code 189.0  Past Surgical History: Reviewed history from 09/04/2006 and no changes required. Lt radical nephrectomy - 02/06/2004, Rt. Lung surgery for pneumothorax - 08/24/2002  Family History: Reviewed history from 09/04/2006 and no changes required. - CVA, DM, + HBP,  CAD, ETOHism, depression  Social History: Reviewed history from 11/26/2006 and no changes required. Smoked 1ppd x 30 years,  RESTARTED and is at 1ppd Quit 2/06; ETOH quit drinking years ago  formerly a problem drinker; disabled 2ndary to bipolar disorder; Saw social worker 1/04.  Receives 100% discount for [facility] services Packs/Day:  0.5  Physical Exam  General:  Well-developed,well-nourished,in no acute distress; alert,appropriate and cooperative throughout examination Abdomen:  Bowel sounds positive,abdomen soft and non-tender without masses, organomegaly or hernias noted.  And no CVA tenderness.   Impression & Recommendations:  Problem # 1:  UTI (ICD-599.0)  Diagnosed at urgent care.  Not certain of criteria.  Finished antibiotics.  Now with totally normal UA.  Reassured him that there is no evidence of malignancy return.  Orders: [facility]- Est Level  3 ([address])  Complete Medication List: 1)  Seroquel 400 Mg Tabs (Quetiapine fumarate) .... One by mouth two times a day 2)  Buspar 5 Mg Tabs (Buspirone hcl) .... One by mouth daily per mental health 3)  Hydrocodone-acetaminophen 5-500 Mg Tabs (Hydrocodone-acetaminophen) .... One by mouth q6h as needed pain  Other Orders: Urinalysis-[facility] (00000)   Laboratory Results   Urine Tests  Date/Time Received: Nov 10, 2009 4:41 PM  Date/Time Reported: Nov 10, 2009 4:47 PM   Routine Urinalysis   Color: yellow Appearance: Clear Glucose: negative   (Normal  Range: Negative) Bilirubin: negative   (Normal Range: Negative) Ketone: negative   (Normal Range: Negative) Spec. Gravity: 1.015   (Normal Range: 1.003-1.035) Blood: negative   (Normal Range: Negative) pH: 7.0   (Normal Range: 5.0-8.0) Protein: negative   (Normal Range: Negative) Urobilinogen: 0.2   (Normal Range: 0-1) Nitrite: negative   (Normal Range: Negative) Leukocyte Esterace: negative   (Normal Range: Negative)    Comments: ...........test performed by...........Marland KitchenTerese Door,  CMA

## 2010-08-07 NOTE — Progress Notes (Signed)
Summary: Lab Res  Phone Note Call from Patient Call back at Home Phone [phone]   Caller: Patient Summary of Call: Pt checking on blood work.   Initial call taken by: Clydell Hakim,  September 27, 2009 10:38 AM  Follow-up for Phone Call        will forward to MD. Follow-up by: Theresia Lo RN,  September 27, 2009 12:25 PM  Additional Follow-up for Phone Call Additional follow up Details #1::        Called and informed. Additional Follow-up by: Doralee Albino MD,  September 27, 2009 1:15 PM

## 2010-08-07 NOTE — Assessment & Plan Note (Signed)
Summary: sore throat,df   Vital Signs:  Patient profile:   60 year old male Height:      67 inches Weight:      153.5 pounds BMI:     24.13 Temp:     97.9 degrees F oral Pulse rate:   77 / minute BP sitting:   148 / 92  (left arm) Cuff size:   regular  Vitals Entered By: Gladstone Pih (August 04, 2009 3:08 PM) CC: C/O losing voice Is Patient Diabetic? No Pain Assessment Patient in pain? no        Primary Care Provider:  Doralee Albino MD  CC:  C/O losing voice.  History of Present Illness: Sore throat and laryngitis for over one week Made to go to inpatient treatment for being busted with drugs in his car (he denies use but his family members have expressed concerns to me in the past)  He states the drugs belonged to a friend.  He has know bipolar disorder and now states he is having panic attacks "I got both manic attacks and panic attacks. It's bad Dr. Leveda Anna"  States his anxiety is too high to attend outpatient treatment.  His parole officer is not understanding.  He wants a letter excusing him from outpatient therapy.  He is followed by mental health.  Next appointment is not until late Feb.  I did provide a note (see letter from today.)  Explained this is really an issue for mental health and they should be the ones to increase/change meds and to provide parole officer the long term plan on outpatient drug rehab therapy.  Habits & Providers  Alcohol-Tobacco-Diet     Tobacco Status: current     Tobacco Counseling: to quit use of tobacco products     Cigarette Packs/Day: 1.0  Current Medications (verified): 1)  Seroquel 200 Mg  Tabs (Quetiapine Fumarate) .... One Three Times A Day 2)  Buspar 5 Mg Tabs (Buspirone Hcl) .... One By Mouth Daily Per Mental Health 3)  Nicotrol 10 Mg Inha (Nicotine) .... Use As Directed.  Disp Qs One Month Supply  Allergies (verified): 1)  Codeine  Physical Exam  General:  Well-developed,well-nourished,in no acute distress;  alert,appropriate and cooperative throughout examination Mouth:  mild erythema Neck:  no sig adenopathy Psych:  some pressure speech - volume and intensity increased as he spoke of the parole officer. NO SI/HI.  No delusions.   Impression & Recommendations:  Problem # 1:  PANIC DISORDER (ICD-300.01)  His updated medication list for this problem includes:    Buspar 5 Mg Tabs (Buspirone hcl) ..... One by mouth daily per mental health  Orders: [facility]- Est  Level 4 ([address])  Problem # 2:  BIPOLAR DISORDER (ICD-296.7)  Orders: [facility]- Est  Level 4 ([address])  Problem # 3:  PHARYNGITIS (ICD-462)  Orders: [facility]- Est  Level 4 ([address])  Complete Medication List: 1)  Seroquel 200 Mg Tabs (Quetiapine fumarate) .... One three times a day 2)  Buspar 5 Mg Tabs (Buspirone hcl) .... One by mouth daily per mental health 3)  Nicotrol 10 Mg Inha (Nicotine) .... Use as directed.  disp qs one month supply Prescriptions: NICOTROL 10 MG INHA (NICOTINE) Use as directed.  Disp QS one month supply  #1 x 3   Entered and Authorized by:   Doralee Albino MD   Signed by:   Doralee Albino MD on 08/04/2009   Method used:   Electronically to        [facility]  [address] [phone]* (retail)       [address]       [place], [place]  [address]       Ph: [phone] or [phone]       Fax: [phone]   RxID:   [id number]

## 2010-08-07 NOTE — Assessment & Plan Note (Signed)
Summary: f/u last visit/eo   Vital Signs:  Patient profile:   60 year old male Height:      67 inches Weight:      147.8 pounds BMI:     23.23 Temp:     97.7 degrees F oral Pulse rate:   65 / minute BP sitting:   135 / 83  (left arm) Cuff size:   regular  Vitals Entered By: Gladstone Pih (October 11, 2009 2:28 PM) CC: F/U from last visit Is Patient Diabetic? No Pain Assessment Patient in pain? no        Primary Care Provider:  Doralee Albino MD  CC:  F/U from last visit.  History of Present Illness: Feels good.  No further syncope.  Home BPs are good.  Reviewed test results all normal.  Discussed low blood sugar.  Clean and sober.  Is comfortable with the notion that no further workup is needed at this time.  Did review all lab tests to date.  Habits & Providers  Alcohol-Tobacco-Diet     Tobacco Status: current     Tobacco Counseling: to quit use of tobacco products     Cigarette Packs/Day: 1.0  Current Medications (verified): 1)  Seroquel 400 Mg Tabs (Quetiapine Fumarate) .... One By Mouth Two Times A Day 2)  Buspar 5 Mg Tabs (Buspirone Hcl) .... One By Mouth Daily Per Mental Health 3)  Hydrocodone-Acetaminophen 5-500 Mg Tabs (Hydrocodone-Acetaminophen) .... One By Mouth Q6h As Needed Pain  Allergies (verified): 1)  Codeine PMH-FH-SH reviewed-no changes except otherwise noted  Social History: Smoking Status:  current  Physical Exam  General:  Well-developed,well-nourished,in no acute distress; alert,appropriate and cooperative throughout examination Lungs:  Normal respiratory effort, chest expands symmetrically. Lungs are clear to auscultation, no crackles or wheezes. Heart:  Normal rate and regular rhythm. S1 and S2 normal without gallop, murmur, click, rub or other extra sounds.   Impression & Recommendations:  Problem # 1:  SYNCOPE (ICD-780.2) Assessment Improved  Observe.  No further WU at this time.    Orders: [facility]- Est Level  3 ([address])  Complete  Medication List: 1)  Seroquel 400 Mg Tabs (Quetiapine fumarate) .... One by mouth two times a day 2)  Buspar 5 Mg Tabs (Buspirone hcl) .... One by mouth daily per mental health 3)  Hydrocodone-acetaminophen 5-500 Mg Tabs (Hydrocodone-acetaminophen) .... One by mouth q6h as needed pain

## 2010-08-07 NOTE — Letter (Signed)
Summary: Generic Letter  [facility]  [address]   [place], [place] [address]   Phone: [phone]  Fax: [phone]    08/04/2009  Jahad Sampley [address] [place], [place]  [address]  Dear Mr. Glendinning,   I am happy to provide this letter to your parole officer regarding your outpatient treatment.  You are current on medications for bipolar disorder and for panic attacks through mental health.  The medication, buspar, for the panic attacks was recently started.  It takes typically 2-3 weeks to become effective.  The current number of panic attackes makes it difficult to attend the outpatient program.     I believe these concerns are legitimate.  Ideally, the psychiatrists at mental health should say what to do, but you don't have an appointment with them until mid Feb.  It might be reasonable to delay the program of a couple of weeks to allow the buspar to be effective.  Sincerely,      Doralee Albino MD

## 2010-08-07 NOTE — Progress Notes (Signed)
Summary: phn msg  Phone Note Call from Patient Call back at Home Phone [phone]   Caller: Patient Summary of Call: has a question about ACETAMINOPHEN 500 MG TABS that was called in yesterday Initial call taken by: De Nurse,  June 14, 2010 2:00 PM  Follow-up for Phone Call        patient voices concern that the only med given for pain was acetamenophen. states he is having a migraine with all the coughing . wants something stronger for headache. advised will call MD. Follow-up by: Theresia Lo RN,  June 14, 2010 3:03 PM  Additional Follow-up for Phone Call Additional follow up Details #1::        paged Dr. Wallene Huh and he declines to give stronger medication. suggested to consult with Dr.  Leveda Anna. Dr. Leveda Anna notified. Additional Follow-up by: Theresia Lo RN,  June 14, 2010 3:04 PM    Additional Follow-up for Phone Call Additional follow up Details #2::    done  Follow-up by: Doralee Albino MD,  June 14, 2010 3:14 PM  New/Updated Medications: HYDROCODONE-ACETAMINOPHEN 5-500 MG TABS (HYDROCODONE-ACETAMINOPHEN) one by mouth q4h as needed pain Prescriptions: HYDROCODONE-ACETAMINOPHEN 5-500 MG TABS (HYDROCODONE-ACETAMINOPHEN) one by mouth q4h as needed pain  #8 x 0   Entered and Authorized by:   Doralee Albino MD   Signed by:   Doralee Albino MD on 06/14/2010   Method used:   Printed then faxed to ...       [facility] [address] [phone]* (retail)       [address]       [place], [place]  [address]       Ph: [phone] or [phone]       Fax: [phone]   RxID:   [id number] HYDROCODONE-ACETAMINOPHEN 5-500 MG TABS (HYDROCODONE-ACETAMINOPHEN) one by mouth q4h as needed pain  #8 x 0   Entered by:   Doralee Albino MD   Authorized by:   . RN TEAM-[facility]   Signed by:   Doralee Albino MD on 06/14/2010   Method used:   Printed then faxed to ...       [facility] [address] [phone]* (retail)       [address]       [place], [place]  [address]       Ph: [phone] or [phone]       Fax: [phone]   RxID:   [id number]

## 2010-08-07 NOTE — Miscellaneous (Signed)
Summary: Buspar refill via fax request  Clinical Lists Changes  Medications: Changed medication from BUSPAR 5 MG TABS (BUSPIRONE HCL) one by mouth daily per mental health to BUSPIRONE HCL 5 MG TABS (BUSPIRONE HCL) one by mouth each morning - Signed Rx of BUSPIRONE HCL 5 MG TABS (BUSPIRONE HCL) one by mouth each morning;  #30 x 5;  Signed;  Entered by: Doralee Albino MD;  Authorized by: Doralee Albino MD;  Method used: Handwritten    Prescriptions: BUSPIRONE HCL 5 MG TABS (BUSPIRONE HCL) one by mouth each morning  #30 x 5   Entered and Authorized by:   Doralee Albino MD   Signed by:   Doralee Albino MD on 03/20/2010   Method used:   Handwritten   RxID:   [id number]

## 2010-08-07 NOTE — Progress Notes (Signed)
Summary: Rx Prob  Phone Note Call from Patient Call back at Home Phone [phone]   Caller: Patient Summary of Call: Pt was seen Friday and said he was to have an rx [facility] into [facility] [address] Initial call taken by: Clydell Hakim,  August 07, 2009 11:26 AM  Follow-up for Phone Call       Follow-up by: Golden Circle RN,  August 07, 2009 11:29 AM    Prescriptions: NICOTROL 10 MG INHA (NICOTINE) Use as directed.  Disp QS one month supply  #1 x 3   Entered by:   Golden Circle RN   Authorized by:   Doralee Albino MD   Signed by:   Golden Circle RN on 08/07/2009   Method used:   Electronically to        [facility]. [address] [phone]* (retail)       [address]       [place], [place]  [address]       Ph: [phone] or [phone]       Fax: [phone]   RxID:   [id number]

## 2010-08-07 NOTE — Assessment & Plan Note (Signed)
Summary: possible kidney stones per pt/eo   Vital Signs:  Patient profile:   60 year old male Height:      67 inches Weight:      149.9 pounds BMI:     23.56 Temp:     97.7 degrees F oral Pulse rate:   95 / minute BP sitting:   144 / 95  (left arm) Cuff size:   regular  Vitals Entered By: Gladstone Pih (September 20, 2009 4:02 PM) CC: Kidney stones Is Patient Diabetic? No Pain Assessment Patient in pain? yes     Location: lower back Intensity: 5 Type: dull Onset of pain  X 2 weeks   Primary Care Provider:  Doralee Albino MD  CC:  Kidney stones.  History of Present Illness: Kidney stones history of.  Will seen Dr. Lynden Ang on 3/23. He is due for cancer scan anyway.   S/P Left radical nephrectomy.  Has known rt sided stones - last scan verified in 8/09.  Also having syncope.  Three in last three months.  All three episodes occured while standing.  No chest pain.  No incontinence.  All episodes occurred while standing.  Only out for seconds.    Still smokes.    Habits & Providers  Alcohol-Tobacco-Diet     Tobacco Status: never     Tobacco Counseling: to quit use of tobacco products     Cigarette Packs/Day: 1.0  Current Medications (verified): 1)  Seroquel 400 Mg Tabs (Quetiapine Fumarate) .... One By Mouth Two Times A Day 2)  Buspar 5 Mg Tabs (Buspirone Hcl) .... One By Mouth Daily Per Mental Health  Allergies (verified): 1)  Codeine  Past History:  Past medical, surgical, family and social histories (including risk factors) reviewed, and no changes noted (except as noted below).  Past Medical History: Reviewed history from 09/04/2006 and no changes required. bilateral frozen shoulder, psych meds per mental health, Renal cancer 7/05 Code 189.0  Past Surgical History: Reviewed history from 09/04/2006 and no changes required. Lt radical nephrectomy - 02/06/2004, Rt. Lung surgery for pneumothorax - 08/24/2002  Family History: Reviewed history from 09/04/2006 and no  changes required. - CVA, DM, + HBP, CAD, ETOHism, depression  Social History: Reviewed history from 11/26/2006 and no changes required. Smoked 1ppd x 30 years,  RESTARTED and is at 1ppd Quit 2/06; ETOH quit drinking years ago  formerly a problem drinker; disabled 2ndary to bipolar disorder; Saw social worker 1/04.  Receives 100% discount for [facility] services Smoking Status:  never  Physical Exam  General:  Well-developed,well-nourished,in no acute distress; alert,appropriate and cooperative throughout examination Lungs:  Normal respiratory effort, chest expands symmetrically. Lungs are clear to auscultation, no crackles or wheezes. Heart:  Normal rate and regular rhythm. S1 and S2 normal without gallop, murmur, click, rub or other extra sounds. Abdomen:  Bowel sounds positive,abdomen soft and non-tender without masses, organomegaly or hernias noted.   Impression & Recommendations:  Problem # 1:  SYNCOPE (ICD-780.2) Abnormal EKG with incomplete RBBB, borderline HBP and hx of cocaine use.  Will work up possible cardiac origin further with echo  FU based on results, which I will call with. Orders: 12 Lead EKG (12 Lead EKG) Basic Met-[facility] (13086-57846) CBC-[facility] (96295) 2 D Echo (2 D Echo) [facility]- Est  Level 4 (28413)  Problem # 2:  NEPHROLITHIASIS (ICD-592.0) No hematuria but previously documented stones.  Will give small amount of narcotics which must last until his visit with urologist.   Orders: Urinalysis-[facility] (00000) [facility]- Est  Level 4 (99214)  Problem # 3:  HYPERTENSION, BENIGN SYSTEMIC (ICD-401.1) Off meds for a while.  May need restart - but wait until syncope workup more complete. Orders: [facility]- Est  Level 4 (99214)  Complete Medication List: 1)  Seroquel 400 Mg Tabs (Quetiapine fumarate) .... One by mouth two times a day 2)  Buspar 5 Mg Tabs (Buspirone hcl) .... One by mouth daily per mental health 3)  Hydrocodone-acetaminophen 5-500 Mg Tabs (Hydrocodone-acetaminophen) .... One by  mouth q6h as needed pain Prescriptions: HYDROCODONE-ACETAMINOPHEN 5-500 MG TABS (HYDROCODONE-ACETAMINOPHEN) One by mouth q6h as needed pain  #20 x 0   Entered and Authorized by:   Doralee Albino MD   Signed by:   Doralee Albino MD on 09/20/2009   Method used:   Handwritten   RxID:   [id number]   Laboratory Results   Urine Tests  Date/Time Received: September 20, 2009 4:30 PM  Date/Time Reported: September 20, 2009 5:16 PM   Routine Urinalysis   Color: yellow Appearance: Clear Glucose: negative   (Normal Range: Negative) Bilirubin: negative   (Normal Range: Negative) Ketone: negative   (Normal Range: Negative) Spec. Gravity: 1.020   (Normal Range: 1.003-1.035) Blood: negative   (Normal Range: Negative) pH: 6.5   (Normal Range: 5.0-8.0) Protein: negative   (Normal Range: Negative) Urobilinogen: 0.2   (Normal Range: 0-1) Nitrite: negative   (Normal Range: Negative) Leukocyte Esterace: negative   (Normal Range: Negative)    Comments: ...............test performed by......Marland KitchenBonnie A. [place], MLS (ASCP)cm       Prevention & Chronic Care Immunizations   Influenza vaccine: Not documented   Influenza vaccine deferral: Refused  (04/26/2009)    Tetanus booster: 10/06/2004: Done.   Tetanus booster due: 10/07/2014    Pneumococcal vaccine: Not documented  Colorectal Screening   Hemoccult: Not documented   Hemoccult action/deferral: Ordered  (04/26/2009)    Colonoscopy: Not documented  Other Screening   PSA: 0.96  (05/18/2008)   Smoking status: never  (09/20/2009)  Lipids   Total Cholesterol: 147  (05/18/2008)   LDL: 85  (05/18/2008)   LDL Direct: Not documented   HDL: 48  (05/18/2008)   Triglycerides: 69  (05/18/2008)  Hypertension   Last Blood Pressure: 144 / 95  (09/20/2009)   Serum creatinine: 1.25  (04/26/2009)   BMP action: Ordered   Serum potassium 4.4  (04/26/2009)    Hypertension flowsheet reviewed?: Yes   Progress toward BP goal:  Unchanged  Self-Management Support :   Personal Goals (by the next clinic visit) :      Personal blood pressure goal: 140/90  (04/26/2009)   Hypertension self-management support: Written self-care plan  (09/20/2009)   Hypertension self-care plan printed.

## 2010-08-20 ENCOUNTER — Telehealth: Payer: Self-pay | Admitting: Family Medicine

## 2010-08-20 NOTE — Telephone Encounter (Signed)
done

## 2010-09-05 ENCOUNTER — Ambulatory Visit: Payer: Self-pay | Admitting: Family Medicine

## 2010-10-23 ENCOUNTER — Telehealth: Payer: Self-pay | Admitting: Family Medicine

## 2010-10-23 NOTE — Telephone Encounter (Signed)
Calling about form his brother Dorinda Hill left.  Want to know if it has been completed or not.  Also need to follow up report from Dr. Normajean Baxter regarding his kidney

## 2010-10-31 NOTE — Telephone Encounter (Signed)
Filled out form which he needs to sign.  He wants a second opinion from me about his renal cancer.  He will make an apppointment.

## 2010-11-05 ENCOUNTER — Telehealth: Payer: Self-pay | Admitting: *Deleted

## 2010-11-05 MED ORDER — QUETIAPINE FUMARATE 400 MG PO TABS: 400.0000 mg | ORAL_TABLET | Freq: Two times a day (BID) | ORAL | Status: DC

## 2010-11-05 NOTE — Telephone Encounter (Signed)
Pharmacy has been trying to fax refill request and we have been unable to receive. Also when they send it electronically we get an error message.    patient needs refill on Quetiapine 400 mg . [facility], [place]. Will forward message to Dr. Leveda Anna

## 2010-11-05 NOTE — Telephone Encounter (Signed)
done

## 2010-11-20 NOTE — H&P (Signed)
NAMECARMEL, [name] NO.:  [id number]   MEDICAL RECORD NO.:  [id number]          PATIENT TYPE:  IPS   LOCATION:  0505                          FACILITY:  [facility]   PHYSICIAN:  Anselm Jungling, MD  DATE OF BIRTH:  05/19/1951   DATE OF ADMISSION:  01/26/2007  DATE OF DISCHARGE:                       PSYCHIATRIC ADMISSION ASSESSMENT   IDENTIFYING INFORMATION:  This is a 60 year old single white male  voluntarily admitted on January 26, 2007.   HISTORY OF PRESENT ILLNESS:  The patient presents with a history of  polysubstance abuse.  Has been drinking up to a case per day and smoking  crack cocaine.  He reports no history of seizures.  He was having  suicidal thoughts with no specific plan.  Reports a decrease in his  sleep.   PAST PSYCHIATRIC HISTORY:  First admission to [facility].  No current outpatient treatment.   SOCIAL HISTORY:  This is a 60 year old single white male.  Has one  child.  The patient lives with his mother.  He is on disability due to  his medical and psychiatric problems.   FAMILY HISTORY:  None that he is aware of.   ALCOHOL/DRUG HISTORY:  The patient smokes and drinking and drug habits  as described above.   PRIMARY CARE PHYSICIAN:  Primary care Camay Pedigo is Dr. Marliss Coots at the  [facility].  Also has a family doctor, Dr. Mick Sell, at [facility].   MEDICAL PROBLEMS:  History of cancer of the kidney and hypertension.   MEDICATIONS:  The patient has been on metoprolol 100 mg b.i.d. and  Seroquel 100 mg q.i.d.  Some questionable noncompliance with his  Seroquel.   ALLERGIES:  No known allergies.   PHYSICAL EXAMINATION:  This is a middle-aged male in no acute distress.  He was assessed at [facility].  He received three dose  of Ativan during his ER stay.  Temperature is 97.2, heart rate 72,  respirations 16, blood pressure 148/96.  His height is 6 feet 1 inch,  weight is 160 pounds.   LABORATORY DATA:  CBC within normal limits.  His complete metabolic  panel within normal limits.  Blood alcohol level of 38.  Urinalysis  shows trace leukocyte esterase with urine drug screen positive for  cocaine.   MENTAL STATUS EXAM:  He is in the bed.  He is sleepy.  Eyes are closed.  Speech is answers are clear and brief.  Mood is tired.  The patient is  irritable.  Thought processes:  appears to be no obvious thought  disorder.  Cognitive function intact.  He is aware of himself and  situation.  Does not elaborate and somewhat of a poor historian.   DIAGNOSES:  AXIS I:  Polysubstance dependence.  AXIS II:  Deferred.  AXIS III:  Cancer of the kidney and hypertension.  AXIS IV:  Psychosocial problems, medical problems.  AXIS V:  Current 35-40.   PLAN:  Contract for safety.  Stabilize mood and thinking.  Will detox  the patient with the Librium protocol.  Continue with his metoprolol.  Have Seroquel  available on a p.r.n. basis.  Will work on relapse  prevention.  Casemanager is to address rehab follow-up and assess his  depressive and symptoms of anxiety.  The patient is to follow up with  his providers as advised for his ongoing medical problems.   TENTATIVE LENGTH OF STAY:  Five to six days.      Landry Corporal, N.P.      Anselm Jungling, MD  Electronically Signed    JO/MEDQ  D:  01/28/2007  T:  01/28/2007  Job:  [id number]

## 2010-11-23 ENCOUNTER — Ambulatory Visit: Payer: Self-pay | Admitting: Family Medicine

## 2010-11-23 NOTE — Op Note (Signed)
NAMEJACKSYN, BEEKS NO.:  [id number]   MEDICAL RECORD NO.:  [id number]          PATIENT TYPE:  AMB   LOCATION:  DAY                          FACILITY:  [facility]   PHYSICIAN:  Bertram Millard. Dahlstedt, M.D.DATE OF BIRTH:  06-01-1951   DATE OF PROCEDURE:  11/19/2004  DATE OF DISCHARGE:                                 OPERATIVE REPORT   PREOPERATIVE DIAGNOSIS:  Right renal calculi.   POSTOPERATIVE DIAGNOSIS:  Right renal calculi.   PRINCIPAL PROCEDURES:  1.  Cysto.  2.  Right double-J stent placement.   SURGEON:  Dr. Retta Diones.   ANESTHESIA:  General with LMA.   COMPLICATIONS:  None.   BRIEF HISTORY:  A 60 year old male, who was well over a year out from left  radical nephrectomy for adenocarcinoma of the kidney.  He has no evidence of  recurrence.   He has been having intermittent right flank pain and microscopic hematuria.  He has been known to have renal calculi - he was recently found to have at  least one stone in the area of his right pelvis.  He has two other smaller  stones.   At this point, he presents for cystoscopy and double-J stent placement.  He  will be having lithotripsy following this.   DESCRIPTION OF PROCEDURE:  The patient was administered preoperative IV  antibiotics and taken to the operating room where general anesthetic was  administered.  He was placed in the dorsal lithotomy position.  Genitalia  and perineum were prepped and draped.  A 22-French panendoscope was passed  into his bladder which was normal.  The right ureteral orifice was  cannulated with a guidewire which was advanced up into the right renal  pelvis using fluoroscopic guidance.  Over this, a 24 cm x 6-French double-J  stent was placed.  Good curls were seen proximally and distally.  At this  point, the bladder was drained and the scope removed.   The patient tolerated the procedure well.  He is awakened, extubated, and  taken to the PACU in stable condition.     SMD/MEDQ  D:  11/19/2004  T:  11/19/2004  Job:  [id number]

## 2010-11-23 NOTE — Discharge Summary (Signed)
[name], [name]                       ACCOUNT NO.:  [id number]   MEDICAL RECORD NO.:  [id number]                   PATIENT TYPE:  INP   LOCATION:  0347                                 FACILITY:  [facility]   PHYSICIAN:  Bertram Millard. Dahlstedt, M.D.          DATE OF BIRTH:  1951-02-27   DATE OF ADMISSION:  02/21/2004  DATE OF DISCHARGE:  02/24/2004                                 DISCHARGE SUMMARY   ADMISSION DIAGNOSIS:  Left renal mass.   POSTOPERATIVE DIAGNOSIS:  Left renal cell carcinoma.   PROCEDURES:  Left radical nephrectomy (February 21, 2004).   CONSULTATIONS:  None.   HISTORY OF PRESENT ILLNESS:  [name] is a 60 year old male who  underwent a hematuria evaluation as well as suffering from flank pain when a  CT scan discovered a 12 cm left renal mass consistent with a radiographic  appearance of a renal cell carcinoma.  [name] also has a past medical  history significant for bipolar disorder, not currently managed with  medications.  [name] is admitted to undergo surgical removal of his  tumor in the form of a left radical nephrectomy.   HOSPITAL COURSE:  Following admission, the patient was taken to the  operating room where he underwent a left radical nephrectomy.  The patient  tolerated the procedure well, and there were no complications.  For a full  summary of the surgical procedure, please see the dictated operative note  dated February 21, 2004.  Postoperatively, the patient was transferred to the  floor where he had a PCA pump for postoperative pain management as well as  Toradol.  He remained hemodynamically stable, and on postoperative day #1,  was advanced to a clear liquid diet.  He began to ambulate, and by  postoperative day #2, was changed to p.o. pain medications.  He was given a  Dulcolax suppository to stimulate a bowel movement and by postoperative day  #3, his hemoglobin remained stable, he was without fever, tolerating a p.o.  diet,  and voiding without difficulty.   DISCHARGE MEDICATIONS:  1. Oxycodone.  2. Colace.   DIET:  The patient is discharged to home on a regular diet.   ACTIVITY:  Restricted, and to include no bending, lifting, stooping, or  moving heavy objects.  The patient is encouraged to walk approximately three  times per day.   CONDITION ON DISCHARGE/FOLLOWUP:  On the day of discharge, the patient is  able to tolerate a p.o. diet, ambulate without assistance, and void without  difficulty.  He has been instructed to call Dr. Lenoria Chime office or the  urologist on call with any further questions or concerns.  This includes a  temperature greater than 101.5, as well as any erythema, drainage, or  opening of the patient's surgical wound.   Dr. Lenoria Chime office will contact the patient for a follow up appointment  where he will undergo staple removal and discuss further postoperative  surveillance  of his renal cell carcinoma.     Thyra Breed, MD                            Bertram Millard. Dahlstedt, M.D.    EG/MEDQ  D:  02/24/2004  T:  02/26/2004  Job:  [id number]

## 2010-11-23 NOTE — Op Note (Signed)
[name], [name]                       ACCOUNT NO.:  [id number]   MEDICAL RECORD NO.:  [id number]                   PATIENT TYPE:  INP   LOCATION:  0347                                 FACILITY:  [facility]   PHYSICIAN:  Bertram Millard. Dahlstedt, M.D.          DATE OF BIRTH:  1951-03-27   DATE OF PROCEDURE:  02/21/2004  DATE OF DISCHARGE:                                 OPERATIVE REPORT   PREOPERATIVE DIAGNOSES:  Left renal mass.   POSTOPERATIVE DIAGNOSES:  Left renal mass.   PROCEDURE:  Left radical nephrectomy.   SURGEON:  Bertram Millard. Retta Diones, M.D.   RESIDENT SURGEON:  Thyra Breed, M.D.   ANESTHESIA:  General endotracheal.   ESTIMATED BLOOD LOSS:  100 mL.   DRAINS:  65 French Foley catheter to straight drain.   SPECIMENS:  Left kidney with surrounding Gerota's fascia en-bloc.   COMPLICATIONS:  None.   INDICATIONS FOR PROCEDURE:  [name] is a 60 year old male initially  evaluated by Dr. Aldean Ast for a left sided renal mass.  He underwent a  hematuria evaluation and was also suffering from some flank pain when a CT  scan discovered a 12 cm left renal mass. The CT scan also showed some  perihilar adenopathy. [name] past medical history is significant  for bipolar disorder, however, he is noncompliant with his medications.  Mr.  Gray has consented for surgical removal of his tumor as definitive  management. Specifically, he has consented to undergo a left radical  nephrectomy.  All the risks, benefits, and alternatives of the procedure  have been described in detail and he is willing to proceed.  Informed  consent has been obtained.   DESCRIPTION OF PROCEDURE:  Following identification by his arm bracelet, the  patient was brought to the operating room and placed in the supine position.  Here he received preoperative IV antibiotics and underwent successful  induction of general endotracheal anesthesia. He was then positioned in the  modified left flank  up keeping his pelvis flat to facilitate a  thoracoabdominal incision. The bean bag was utilized to assist in  positioning.  The patient was quite skinny as he had noticed some weight  loss prior to his surgery. We began our incision just above the 11th rib  carrying our incision towards the umbilicus to the lateral border of the  rectus. The incision was then carried down through minimal subcutaneous  tissue using Bovie electrocautery. The incision was then opened in anatomic  fashion preserving the fascial attachments of the internal oblique, external  oblique and the transversalis abdominis to facilitate a three layer closure.  We then continued our incision until the peritoneum was encountered.  The  Bovie was then used to facilitate opening the rest of the incision. We then  placed the Bookwalter self retaining retractor. Metzenbaum scissors were  then used to carefully dissect along the white line of Toldt to reflect the  colon medially on the  lateral aspect of the abdominal wall.  This exposed  several large veins specifically to the superior aspect of the kidney where  the known tumor resided.  Care was taken to avoid damage to these veins  throughout the dissection. We then moved superiorly where the tumor abutted  the spleen. There was a narrow area of dissection as the spleen was closely  adherent to the superior pole of the kidney. Again Metzenbaum scissors were  used to carefully dissect the superior aspect of the mass from the spleen.  There appeared to be no extrarenal involvement of the tumor, however.  Once  the colon was adequate reflected, we used blunt dissection as well as the  right angle and Bovie electrocautery to dissect inferiorly and medially  until the renal hilum was encountered.  First the ureter was dissected out,  two large Hem-o-Lok  clips were used both proximally and distally to take  the ureter and it was then divided.  We then dissected carefully into  the  renal hilum using peanut and sucker dissection. The renal vein was then  exposed and seen to branch at two different points.  The gonadal vein was  also encountered, this was ligated using two Hem-o-Lok  clips.  The renal  artery was then palpated beneath the renal vein. This was exposed using  right angle dissection. We then placed two large Hem-o-Lok  clips proximally  and one distally as well as a #0 silk suture on the renal artery. The renal  artery was then divided with excellent hemostasis. In an identical fashion  prior to the takeoff of the gonadal vein, we placed two large Hem-o-Lok  clips proximally and one distally as well as a #0 silk suture on the renal  vein. The renal vein was then divided.  We carefully followed the kidney  medially until a second renal artery was encountered. However, this was a  branch off of the main renal artery already transected. At any rate, two  large Hem-o-Lok  clips were placed on this before division.  We then  continued with blunt dissection medially until the only attachments  remaining were the superior most aspect to the spleen.  Once the spleen was  freed using the Bovie and Metzenbaum scissors, there were still some medial  attachment at the adrenal gland which had been in large part preserved and  not part of the en-bloc specimen.  Bovie electrocautery was then used to  free the remaining attachment which did include some adrenal gland with the  specimen.  The specimen was then passed from the field. There was some brisk  bleeding from the adrenal gland which was quickly controlled using Bovie  electrocautery as well as large Hem-o-Lok clips to gain hemostasis.  Once  there was minimal bleeding, a piece of Surgicel was used to cover the  adrenal gland and a sponge was used to pack the left upper quadrant for  approximately five minutes. The pack was then removed and there was no further evidence of active bleeding at this site. In  fact, the entire  surgical bed showed excellent hemostasis. The wound was then copiously  irrigated. We then removed the retractor blade.  Of note, once the retractor  blades were removed, it was evident that the 11th rib had been fractured  during the retraction process.  We injected 0.5% Marcaine copiously into the  side of the rib fracture as well as the entire subcutaneous skin and muscle.  We then  closed the incision in anatomic fashion in three layers using 1-0  running PDS suture. The knots were buried as the patient was quite skinny.  Surgical clips were then used to close the skin.  A sterile dressing was  applied. All sponge, needle and instrument counts were correct x2.  The  patient tolerated the procedure well and there were no complications.  Please note that Bertram Millard. Dahlstedt, M.D. was present and participated in  the entire procedure as he was the responsible surgeon.   DISPOSITION:  After awaking from general anesthesia, the patient was  transported to the post anesthesia care unit in stable condition.  From  here, he will be transferred to the floor for further postoperative  evaluation and management.     Thyra Breed, MD                            Bertram Millard. Dahlstedt, M.D.    EG/MEDQ  D:  02/21/2004  T:  02/22/2004  Job:  [id number]

## 2010-11-23 NOTE — Discharge Summary (Signed)
NAMESIGISMUND, CROSS NO.:  [id number]   MEDICAL RECORD NO.:  [id number]          PATIENT TYPE:  IPS   LOCATION:  0505                          [facility]ACILITY[facility]  [facility]   PHYSICIAN:  Anselm Jungling, MD  DATE OF BIRTH:  Aug 08, 1950   DATE OF ADMISSION:  01/26/2007  DATE OF DISCHARGE:  02/02/2007                               DISCHARGE SUMMARY   IDENTIFYING DATA/REASON FOR ADMISSION:  This was an inpatient  psychiatric admission for [name], a 60 year old divorced white male who  was admitted for poly drug and alcohol detoxification.  He came to [place]  with a history of bipolar disorder as well as renal carcinoma.  He had a  history of hypertension.  He had been taking Seroquel prior to  admission.  He reported good supports including his mother and sister.  Please refer to the admission note for further details pertaining to the  symptoms, circumstances and history that led to his hospitalization.   INITIAL DIAGNOSTIC IMPRESSION:  He was given an initial AXIS I diagnosis  of poly drug and alcohol dependence, and history of bipolar disorder.   MEDICAL/LABORATORY:  The patient was medically and physically assessed  by the psychiatric nurse practitioner.  He was continued on Lopressor  100 mg b.i.d.  There were no acute medical issues.   HOSPITAL COURSE:  The patient was admitted to the adult inpatient  psychiatric service.  He presented as a well-nourished, well-developed  male who was alert, fully oriented, and pleasant, but quite sad and  grim.  He denied suicidal ideation and verbalized a strong desire for  help.  There were no signs or symptoms of psychosis or thought disorder.   He was placed on a Librium protocol for alcohol withdrawal.  He was  involved in various therapeutic groups and activities including those  geared towards 12-step recovery.   Initially, he stayed in bed, refusing to go to groups, stating that he  felt rotten.  He looked quite ill and  dysphoric.   On the fourth hospital day, the patient was up and active in the milieu  and went to breakfast.  He continued disheveled and ungroomed and was  mildly tremulous.  He reported that he was feeling somewhat better, but  still quite ill.  He indicated that his mood was depressed but he denied  suicidal ideation.  He began to work with case management regarding  seeking help with finding a residential chemical dependency treatment  program.  He was continued on a Librium withdrawal protocol.   On the fifth hospital day, I met with the patient, and again found him  in bed at 10 a.m.  He looked tired, and depressed, but denied any new  complaints.  He was nontremulous.  When I requested that he get up to go  to the 10 a.m. group, he merely starred back at me.  It was indicated to  the patient that we were going to insist on participation in order to  continue his inpatient stay, as he was ending the Librium withdrawal  protocol dosing schedule.   Following this, his  attendance at group was better.  He complained of  muscle spasms in his back, and the nurse practitioner saw him and  ordered Skelaxin 800 mg t.i.d.  In addition, he reported that at home he  had been taking Seroquel 400 mg at bedtime which had worked very well  for him.  We felt it was reasonable to restart this, and this was well-  tolerated.   The patient was discharged on the eighth hospital day.  He agreed to the  following aftercare plan.   AFTERCARE:  The [facility], for longer term rehabilitative treatment.  He was to follow up  with Dr. Jack Quarto in two weeks for follow-up with medical care.  He was  encouraged to going to Narcotics Anonymous meetings with his brother,  who was also in the recovery program.   DISCHARGE MEDICATIONS:  1. Lopressor 100 mg b.i.d.  2. Seroquel 400 mg q.h.s.   DISCHARGE DIAGNOSES:  AXIS I:  Alcohol and polydrug dependence.   AXIS II:  Deferred.  AXIS III:  History of hypertension.  AXIS IV:  Stressors:  Severe.  AXIS V:  GAF on discharge 55.      Anselm Jungling, MD  Electronically Signed     SPB/MEDQ  D:  02/23/2007  T:  02/23/2007  Job:  [id number]

## 2010-11-23 NOTE — Op Note (Signed)
NAME:  [name], [name] NO.:  [id number]   MEDICAL RECORD NO.:  [id number]                   PATIENT TYPE:  REC   LOCATION:  OREH                                 FACILITY:  [facility]   PHYSICIAN:  Burnard Bunting, M.D.                 DATE OF BIRTH:  1951-06-12   DATE OF PROCEDURE:  10/12/2002  DATE OF DISCHARGE:                                 OPERATIVE REPORT   PREOPERATIVE DIAGNOSIS:  Right frozen shoulder.   POSTOPERATIVE DIAGNOSIS:  Right frozen shoulder.   OPERATION/PROCEDURE:  Right shoulder arthroscopy, debridement and  manipulation under anesthesia.   SURGEON:  Burnard Bunting, M.D.   ANESTHESIA:  General endotracheal anesthesia.   ESTIMATED BLOOD LOSS:  10 cc.   DRAINS:  None.   INDICATIONS:  Preoperative examination under anesthesia:  External rotation  15 degrees, abduction on the right is 20 degrees.  Isolated glenohumeral  adduction is 40 degrees.  Isolated forward flexion is 50 degrees.   DESCRIPTION OF PROCEDURE:  The patient was brought to the operating room  where general endotracheal anesthesia was induced.  Preoperative antibiotics  were administered.  With the patient in the supine position, he was  manipulated into full forward flexion, full isolated glenohumeral adduction,  90 degrees of external rotation, 15 degrees of abduction, 90 degrees of  external rotation, 90 degrees of abduction and 80 degrees of internal  rotation, 90 degrees of abduction at this time.  The right arm and shoulder  were prepped with DuraPrep solution and draped in the sterile manner.  Topographical anatomy of the right shoulder was identified including the  anterior, posterior and lateral margins of the acromion.  A posterior portal  was created 2 cm medial and inferior to the posterolateral margin of the  acromion.  The glenohumeral joint was entered.  Anterior portals were  created through the rotator interval using spinal needle localization.  At  this time significant synovitis was noted within the glenohumeral joint.  The biceps tendon was intact, rotator cuff was intact.  There were minimal  glenohumeral degenerative changes noted.  The shoulder was debrided of  synovitis.  Rotator interval was resected distally towards along the biceps.  The coracoacromial ligament was visualized.  Following debridement of the  rotator interval, the subacromial space was entered.  Complete bursectomy  was performed.  Acromioplasty was not performed.  Following bursectomy and  debridement, the instruments were removed from their portals.  A lateral  portal was created for the bursectomy.  The instruments were removed from  their portals. Incisions were closed using 3-0 nylon suture.  Joint and  subacromial space were injected with divided solution of 20 cc of Marcaine  with epinephrine, 8 cc of morphine and 40 mg of Kenalog.  The patient  tolerated the procedure well without immediate complications.  Burnard Bunting, M.D.   GSD/MEDQ  D:  10/14/2002  T:  10/16/2002  Job:  [id number]

## 2010-11-23 NOTE — Op Note (Signed)
[name], [name]                       ACCOUNT NO.:  [id number]   MEDICAL RECORD NO.:  [id number]                   PATIENT TYPE:  INP   LOCATION:  5013                                 FACILITY:  [facility]   PHYSICIAN:  Jene Every, M.D.                 DATE OF BIRTH:  28-Nov-1950   DATE OF PROCEDURE:  09/05/2003  DATE OF DISCHARGE:                                 OPERATIVE REPORT   PREOPERATIVE DIAGNOSIS:  Left subcapital hip fracture.   POSTOPERATIVE DIAGNOSIS:  Left subcapital hip fracture.   OPERATION PERFORMED:  Open reduction internal fixation left hip fracture.   SURGEON:  Jene Every, M.D.   ANESTHESIA:  General.   INDICATIONS FOR PROCEDURE:  59 year old fell on Friday evening, ambulated  Saturday on his hip, presented Sunday complaining of pain.  X-rays  indicating an impacted subcapital hip fracture.  Operative intervention was  indicated for stabilization.  Risks and benefits were discussed  including  bleeding, infection, damage to neurovascular structures, DVT, PE, malunion,  nonunion, need for total hip replacement etc.   DESCRIPTION OF PROCEDURE:  Patient supine position.  After adequate general  anesthesia, 1 g Kefzol, he was placed in gentle longitudinal traction  internal rotation, well leg in gentle flexion external rotation, well  padded.  The left peritrochanteric hip, thigh was prepped and draped in the  usual sterile fashion.  Under C-arm augmentation incision was made over the  greater trochanter.  Just beneath it, subcutaneous tissue was dissected.  Electrocautery utilized to achieve hemostasis.  I divided the fascia.  Advanced the guide pin up into the femoral head and neck across the fracture  site advancing slowly in the AP and lateral plane. Not reaching the hip  joint.  Ace cannulated screw 65 was then inserted, first 85 mg in length,  the next above and cephalad and caudad to the initial screw and slightly  anterior and posterior observing  in the AP and lateral plane at all times  with slow advance.  Excellent purchase.  Three screws were obtained with  excellent purchase across the fracture site.  Guide pins were then removed  rotated hip and it moved as one unit.  The wound was then copiously  irrigated and closed fascia with 0 Vicryl simple sutures subcutaneous tissue  with 2-0 simple sutures, skin staples.  Wound dressed sterilely.  He was  released from the fracture table and placed onto the hospital bed just prior  to that we noted that the Foley catheter was not draining urine.  The Foley  catheter was then removed and a new Foley catheter placed.  Immediate urine  discharged approximately 400 to 500 mL.  There was no blood noted.  The  patient was then extubated without difficulty and transported to recovery in  satisfactory condition.   The patient tolerated the procedure well. There were no complications.   ESTIMATED BLOOD LOSS:  Minimal.  Jene Every, M.D.    Cordelia Pen  D:  09/05/2003  T:  09/05/2003  Job:  [id number]

## 2010-11-23 NOTE — Op Note (Signed)
NAMEPRAKASH, KIMBERLING NO.:  [id number]   MEDICAL RECORD NO.:  [id number]          PATIENT TYPE:  OIB   LOCATION:  5705                         FACILITY:  [facility]   PHYSICIAN:  Leonie Man, M.D.   DATE OF BIRTH:  09-14-1950   DATE OF PROCEDURE:  01/14/2005  DATE OF DISCHARGE:  01/15/2005                                 OPERATIVE REPORT   PREOPERATIVE DIAGNOSIS:  Chronic calculous cystitis.   POSTOPERATIVE DIAGNOSIS:  Chronic calculous cystitis.   PROCEDURE:  Laparoscopic cholecystectomy with intraoperative cholangiogram.   SURGEON:  Leonie Man, M.D.   ASSISTANT:  Marca Ancona, M.D.   ANESTHESIA:  General.   SPECIMENS TO LABORATORY:  Gallbladder.   ESTIMATED BLOOD LOSS:  Minimal.   COMPLICATIONS:  There were no apparent complications and the patient was  returned to the PACU in excellent condition.   NOTE:  The patient is a 60 year old male with presentation of abdominal pain  associated with nausea and vomiting. Gallbladder ultrasound indicated  cholelithiasis.  The patient comes to the operating room after the risks and  potential benefits of surgery have been discussed, questions answered and  consent obtained.   PROCEDURE:  Following the induction of satisfactory anesthesia, the patient  was prepped and draped to be included in a sterile operative field, open  laparoscopy created at the umbilicus with insertion of a Hasson cannula and  insufflation of the peritoneal cavity to 14 mmHg pressure using carbon  dioxide.  Camera was inserted and visual exploration carried out; there were  no abnormalities seen within the abdominal viscera.  The gallbladder was  moderately and chronically scarred.  Under direct vision, epigastric and  lateral ports were placed.  The gallbladder was grasped and retracted  cephalad.  Isolation of the cystic duct and cystic artery and dissecting in  the region of the ampulla of the gallbladder with the cystic duct being  traced up to the cystic duct-gallbladder junction and the cystic artery  traced to its entry into the gallbladder wall.  The cystic duct was clipped  proximally and opened and a cystic duct cholangiogram was carried out by  placing a Cook catheter into the cystic duct and injecting 1/2-strength  Hypaque dye under fluoroscopic guidance.  The resulting cholangiogram showed  a prompt flow of contrast into the duodenum; no filling defects were noted.  Caliber of the extrahepatic ducts were normal.  The cystic duct catheter was  then removed and the cystic duct doubly clipped and transected, the cystic  artery doubly clipped and transected and the gallbladder dissected free from  the liver bed using electrocautery and maintaining hemostasis throughout the  course of the dissection.  At the end of dissection, all the areas in the  liver bed were checked for hemostasis.  Additional areas were treated with  electrocautery.  The gallbladder was then retrieved through the umbilical  port without difficulty.  The right upper quadrant was thoroughly irrigated  and aspirated.  The pneumoperitoneum was then allowed to deflate after the  trocars removed under direct vision.  Wounds were closed in layers as  follows:  After  sponge, instrument and sharp counts were verified, the  umbilical wound in 2 layer with 0 Vicryl and 4-0 Monocryl, epigastric and  flank wounds were closed 4-0 Monocryl sutures, all wounds reinforced with  Steri-Strips, sterile dressings applied, anesthetic reversed, patient  removed from the operating room to the recovery room in stable condition.  He tolerated this procedure well.      Leonie Man, M.D.  Electronically Signed     PB/MEDQ  D:  04/04/2005  T:  04/05/2005  Job:  [id number]
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
## 2010-11-30 ENCOUNTER — Ambulatory Visit (INDEPENDENT_AMBULATORY_CARE_PROVIDER_SITE_OTHER): Payer: PRIVATE HEALTH INSURANCE | Admitting: Family Medicine

## 2010-11-30 ENCOUNTER — Encounter: Payer: Self-pay | Admitting: Family Medicine

## 2010-11-30 VITALS — BP 130/90 | HR 72 | Temp 98.2°F | Ht 66.75 in | Wt 173.6 lb

## 2010-11-30 DIAGNOSIS — Z1211 Encounter for screening for malignant neoplasm of colon: Secondary | ICD-10-CM

## 2010-11-30 DIAGNOSIS — Z125 Encounter for screening for malignant neoplasm of prostate: Secondary | ICD-10-CM | POA: Insufficient documentation

## 2010-11-30 DIAGNOSIS — F17200 Nicotine dependence, unspecified, uncomplicated: Secondary | ICD-10-CM

## 2010-11-30 DIAGNOSIS — I10 Essential (primary) hypertension: Secondary | ICD-10-CM

## 2010-11-30 DIAGNOSIS — N200 Calculus of kidney: Secondary | ICD-10-CM

## 2010-11-30 DIAGNOSIS — F1911 Other psychoactive substance abuse, in remission: Secondary | ICD-10-CM

## 2010-11-30 LAB — POCT UA - MICROSCOPIC ONLY

## 2010-11-30 LAB — POCT URINALYSIS DIPSTICK
Bilirubin, UA: NEGATIVE
Glucose, UA: NEGATIVE
Protein, UA: NEGATIVE
Urobilinogen, UA: 0.2
pH, UA: 6

## 2010-11-30 MED ORDER — NICOTINE 21 MG/24HR TD PT24: 1.0000 | MEDICATED_PATCH | TRANSDERMAL | Status: DC

## 2010-11-30 NOTE — Assessment & Plan Note (Signed)
Remains clean and sober. 

## 2010-11-30 NOTE — Progress Notes (Signed)
  Subjective:    Patient ID: [name], male    DOB: 19-Nov-1950, 60 y.o.   MRN: [id number]  HPI Hearing loss in left ear gradual.  No sense of ear congestion Screened by urology for hx of kidney cancer.  Told recheck in one year.  Wants interim check Bit by dog one week ago Wants patch to help quit smoking Desires hemocults for colon ca screening. Desires PSA screen    Review of Systems     Objective:   Physical Exam Rt index finger with non infected split at dorsal PIP.       Assessment & Plan:

## 2010-11-30 NOTE — Assessment & Plan Note (Signed)
No meds needed.  May have been related to drug abuse.  Not a problem now that he is clean.

## 2010-11-30 NOTE — Progress Notes (Signed)
Addended by: [place], Mat Stuard on: 11/30/2010 04:09 PM   Modules accepted: Orders

## 2010-11-30 NOTE — Assessment & Plan Note (Signed)
Will check UA and BMP for status of remaining kidney

## 2010-12-01 ENCOUNTER — Inpatient Hospital Stay (INDEPENDENT_AMBULATORY_CARE_PROVIDER_SITE_OTHER)
Admission: RE | Admit: 2010-12-01 | Discharge: 2010-12-01 | Disposition: A | Discharge status: Home / Self Care | Payer: PRIVATE HEALTH INSURANCE | Source: Ambulatory Visit | Attending: Family Medicine | Admitting: Family Medicine

## 2010-12-01 DIAGNOSIS — K089 Disorder of teeth and supporting structures, unspecified: Secondary | ICD-10-CM

## 2010-12-01 DIAGNOSIS — I10 Essential (primary) hypertension: Secondary | ICD-10-CM

## 2010-12-01 LAB — BASIC METABOLIC PANEL WITH GFR
Calcium: 9.6 mg/dL (ref 8.4–10.5)
Chloride: 103 mEq/L (ref 96–112)
Creat: 1.78 mg/dL — ABNORMAL HIGH (ref 0.40–1.50)
GFR, Est Non African American: 40 mL/min — ABNORMAL LOW (ref >60)
Glucose, Bld: 76 mg/dL (ref 70–99)

## 2010-12-04 ENCOUNTER — Telehealth: Payer: Self-pay | Admitting: Family Medicine

## 2010-12-04 MED ORDER — BUPROPION HCL 75 MG PO TABS: 75.0000 mg | ORAL_TABLET | Freq: Two times a day (BID) | ORAL | Status: DC

## 2010-12-04 NOTE — Telephone Encounter (Signed)
Pt says the patch prescribed to help him quit smoking is not covered by medicaid, needs [place] to send in something his ins will cover.

## 2010-12-04 NOTE — Telephone Encounter (Signed)
Done and patient notified.

## 2010-12-04 NOTE — Telephone Encounter (Signed)
Spoke with pharmacist and was informed that there is only 1 patch covered by [employer] and that they do not have it in or even if it still covered. Manufacturer is [employer]

## 2010-12-05 ENCOUNTER — Telehealth: Payer: Self-pay | Admitting: Family Medicine

## 2010-12-05 NOTE — Telephone Encounter (Signed)
Pt was given medication to help him quit smoking, pt wants to know if MD would be willing to switch to the nicotene inhaler?

## 2010-12-06 ENCOUNTER — Telehealth: Payer: Self-pay | Admitting: Family Medicine

## 2010-12-06 MED ORDER — NICOTINE POLACRILEX 4 MG MT LOZG: 4.0000 mg | LOZENGE | OROMUCOSAL | Status: AC | PRN

## 2010-12-06 NOTE — Telephone Encounter (Signed)
Pt calling again about this issue, says the pharmacy found out medicaid does not cover the lozenges.

## 2010-12-06 NOTE — Telephone Encounter (Signed)
Did not want antidepressant for smoking cessation.  I found out that Medicaid covers lozenges.  Called and informed.

## 2010-12-06 NOTE — Telephone Encounter (Signed)
Called pt's pharmacy to inquire about medication and Greig Castilla the pharmacist stated that medicare will not cover this because it is an OTC.Laureen Ochs, Viann Shove

## 2010-12-07 NOTE — Telephone Encounter (Signed)
Spoke with pt's mother and asked her to tell pt that medicare will not pay for the meds that he is requesting and that he can purchase the meds himself.Laureen Ochs, Viann Shove

## 2011-01-23 LAB — HEMOCCULT GUIAC POC 1CARD (OFFICE): Fecal Occult Blood, POC: NEGATIVE

## 2011-01-24 NOTE — Progress Notes (Signed)
Addended by: [place], Morganne Haile on: 01/24/2011 07:06 PM   Modules accepted: Orders

## 2011-02-12 ENCOUNTER — Telehealth: Payer: Self-pay | Admitting: Family Medicine

## 2011-02-12 NOTE — Telephone Encounter (Signed)
Called x 2 with no answer.  Will try again tomorrow.  Heme neg x 3.

## 2011-02-12 NOTE — Telephone Encounter (Signed)
[name] is waiting for his results from the lab work for Colon Cancer.  Please give him a call.

## 2011-02-13 NOTE — Telephone Encounter (Signed)
Called and given results

## 2011-03-13 ENCOUNTER — Ambulatory Visit (INDEPENDENT_AMBULATORY_CARE_PROVIDER_SITE_OTHER): Payer: PRIVATE HEALTH INSURANCE | Admitting: Family Medicine

## 2011-03-13 ENCOUNTER — Encounter: Payer: Self-pay | Admitting: Family Medicine

## 2011-03-13 VITALS — BP 138/86 | HR 68 | Temp 97.8°F | Ht 66.75 in | Wt 169.4 lb

## 2011-03-13 DIAGNOSIS — I10 Essential (primary) hypertension: Secondary | ICD-10-CM

## 2011-03-13 DIAGNOSIS — J449 Chronic obstructive pulmonary disease, unspecified: Secondary | ICD-10-CM

## 2011-03-13 DIAGNOSIS — F17200 Nicotine dependence, unspecified, uncomplicated: Secondary | ICD-10-CM

## 2011-03-13 DIAGNOSIS — F319 Bipolar disorder, unspecified: Secondary | ICD-10-CM

## 2011-03-13 DIAGNOSIS — Z0000 Encounter for general adult medical examination without abnormal findings: Secondary | ICD-10-CM

## 2011-03-13 NOTE — Progress Notes (Signed)
  Subjective:    Patient ID: [name], male    DOB: [birth year], 60 y.o.   MRN: [id number]  HPI Annual wellness exam per insurance. Still smokes Clean and sober.   Checks BP at home and good.  Psychiatrist has asked for LFTs   He is healthy and happy right now without complaints. Has insurance forms for me to fill out.   Review of Systems Chest pain, syncope, abnormal bleeding, leg swelling.     Objective:   Physical Exam HEENT normal Lungs Clear Cardiac RRR without m or g Abd benign, Ext no edema Neuro normal        Assessment & Plan:

## 2011-03-13 NOTE — Assessment & Plan Note (Addendum)
Still smokes 1ppd.  Strongly advised to quit.  Wants to try on own.

## 2011-03-14 ENCOUNTER — Encounter: Payer: Self-pay | Admitting: Family Medicine

## 2011-03-14 LAB — LIPID PANEL
Cholesterol: 167 mg/dL (ref 0–200)
HDL: 34 mg/dL — ABNORMAL LOW (ref >39)
Triglycerides: 149 mg/dL (ref <150)
VLDL: 30 mg/dL (ref 0–40)

## 2011-03-14 LAB — COMPLETE METABOLIC PANEL WITH GFR
Albumin: 4.5 g/dL (ref 3.5–5.2)
Alkaline Phosphatase: 62 U/L (ref 39–117)
BUN: 10 mg/dL (ref 6–23)
CO2: 24 mEq/L (ref 19–32)
Calcium: 10.2 mg/dL (ref 8.4–10.5)
GFR, Est African American: >60 mL/min (ref >60)
Glucose, Bld: 78 mg/dL (ref 70–99)
Potassium: 4.5 mEq/L (ref 3.5–5.3)
Sodium: 140 mEq/L (ref 135–145)

## 2011-03-15 DIAGNOSIS — Z0000 Encounter for general adult medical examination without abnormal findings: Secondary | ICD-10-CM | POA: Insufficient documentation

## 2011-03-15 NOTE — Assessment & Plan Note (Signed)
Asymptomatic except for "smokers cough."

## 2011-03-15 NOTE — Assessment & Plan Note (Signed)
Well controled. 

## 2011-03-15 NOTE — Assessment & Plan Note (Signed)
Seems to be very stable at present

## 2011-03-29 ENCOUNTER — Other Ambulatory Visit: Payer: Self-pay | Admitting: Family Medicine

## 2011-03-29 DIAGNOSIS — Z85528 Personal history of other malignant neoplasm of kidney: Secondary | ICD-10-CM

## 2011-04-03 ENCOUNTER — Ambulatory Visit
Admission: RE | Admit: 2011-04-03 | Discharge: 2011-04-03 | Disposition: A | Discharge status: Home / Self Care | Payer: PRIVATE HEALTH INSURANCE | Source: Ambulatory Visit | Attending: Family Medicine | Admitting: Family Medicine

## 2011-04-03 DIAGNOSIS — Z85528 Personal history of other malignant neoplasm of kidney: Secondary | ICD-10-CM

## 2011-04-17 ENCOUNTER — Telehealth: Payer: Self-pay | Admitting: Family Medicine

## 2011-04-17 NOTE — Telephone Encounter (Signed)
Hasn't heard results of CXR - pls call with results

## 2011-04-17 NOTE — Telephone Encounter (Signed)
Called and informed CXR was normal. ?

## 2011-04-22 LAB — BASIC METABOLIC PANEL
CO2: 31
Calcium: 10.3
Creatinine, Ser: 1.3
GFR calc non Af Amer: 58 — ABNORMAL LOW
Glucose, Bld: 85

## 2011-04-22 LAB — RAPID URINE DRUG SCREEN, HOSP PERFORMED
Cocaine: POSITIVE — AB
Opiates: NOT DETECTED
Tetrahydrocannabinol: NOT DETECTED

## 2011-04-22 LAB — DIFFERENTIAL
Basophils Absolute: 0
Basophils Relative: 0
Lymphocytes Relative: 13
Neutro Abs: 6.9
Neutrophils Relative %: 80 — ABNORMAL HIGH

## 2011-04-22 LAB — URINALYSIS, ROUTINE W REFLEX MICROSCOPIC
Bilirubin Urine: NEGATIVE
Glucose, UA: NEGATIVE
Hgb urine dipstick: NEGATIVE
Ketones, ur: NEGATIVE
pH: 6

## 2011-04-22 LAB — CBC
MCHC: 34.7
Platelets: 329
RDW: 13.3

## 2011-04-22 LAB — ETHANOL: Alcohol, Ethyl (B): 38 — ABNORMAL HIGH

## 2011-06-17 ENCOUNTER — Other Ambulatory Visit: Payer: Self-pay | Admitting: Family Medicine

## 2011-06-17 NOTE — Telephone Encounter (Signed)
Refill request

## 2011-09-13 ENCOUNTER — Encounter: Payer: Self-pay | Admitting: Family Medicine

## 2011-09-13 NOTE — Telephone Encounter (Signed)
This encounter was created in error - please disregard.

## 2011-09-13 NOTE — Telephone Encounter (Signed)
Error

## 2011-10-16 ENCOUNTER — Encounter: Payer: Self-pay | Admitting: Family Medicine

## 2011-10-16 ENCOUNTER — Ambulatory Visit (INDEPENDENT_AMBULATORY_CARE_PROVIDER_SITE_OTHER): Payer: PRIVATE HEALTH INSURANCE | Admitting: Family Medicine

## 2011-10-16 VITALS — BP 133/87 | HR 90 | Temp 97.7°F | Wt 153.2 lb

## 2011-10-16 DIAGNOSIS — F17200 Nicotine dependence, unspecified, uncomplicated: Secondary | ICD-10-CM

## 2011-10-16 DIAGNOSIS — J449 Chronic obstructive pulmonary disease, unspecified: Secondary | ICD-10-CM

## 2011-10-16 MED ORDER — AZITHROMYCIN 500 MG PO TABS: 500.0000 mg | ORAL_TABLET | Freq: Every day | ORAL | Status: AC

## 2011-10-16 MED ORDER — PREDNISONE 20 MG PO TABS: 60.0000 mg | ORAL_TABLET | Freq: Every day | ORAL | Status: AC

## 2011-10-16 MED ORDER — ALBUTEROL SULFATE HFA 108 (90 BASE) MCG/ACT IN AERS: 2.0000 | INHALATION_SPRAY | RESPIRATORY_TRACT | Status: AC | PRN

## 2011-10-17 ENCOUNTER — Telehealth: Payer: Self-pay | Admitting: Family Medicine

## 2011-10-17 NOTE — Telephone Encounter (Signed)
Pt spoke with insurance and they will pay for nicotine patch if it's a script, even tho it's OTC, he needs script for them to pay for it.  [facility] [employer]

## 2011-10-17 NOTE — Telephone Encounter (Signed)
Fwd. To PCP for Rx.

## 2011-10-18 ENCOUNTER — Telehealth: Payer: Self-pay | Admitting: Family Medicine

## 2011-10-18 MED ORDER — NICOTINE 21 MG/24HR TD PT24: 1.0000 | MEDICATED_PATCH | TRANSDERMAL | Status: AC

## 2011-10-18 NOTE — Telephone Encounter (Signed)
[name] could not purchase over the counter patches to stop smoking, so he need a rx sent to pharmacy to obtain them.

## 2011-10-18 NOTE — Patient Instructions (Signed)
Quit smoking.  It is causing permanent damage to your lungs I sent two prescriptions to your pharmacy.   Call back in two days if not improving.

## 2011-10-18 NOTE — Progress Notes (Signed)
  Subjective:    Patient ID: [name], male    DOB: Nov 22, 1950, 61 y.o.   MRN: [id number]  HPICough, fever and shortness of breath for 6 days.  Continues to smoke. Positive sick contacts.    Review of Systems     Objective:   Physical Exam Lungs, diffuse bilateral insp and exp wheeze. No dyspnea at rest.  Good air movement       Assessment & Plan:

## 2011-10-18 NOTE — Assessment & Plan Note (Signed)
Strongly suggest smoking cessation

## 2011-10-18 NOTE — Assessment & Plan Note (Signed)
Exacerbation.  Rx with pred and antibiotics

## 2011-10-21 ENCOUNTER — Other Ambulatory Visit: Payer: Self-pay | Admitting: Family Medicine

## 2011-10-21 NOTE — Telephone Encounter (Signed)
Will forward to pcp for advice.[name]  

## 2011-10-21 NOTE — Telephone Encounter (Signed)
Patient is calling because he wants the patches to stop smoking to be a prescribed medication that Medicaid will pay for.  The Nicoderm that was sent is over the counter.

## 2011-10-22 NOTE — Telephone Encounter (Signed)
[name] is having problems now with sinus congestion.  Need something sent to pharmacy for relief.

## 2011-10-23 NOTE — Telephone Encounter (Signed)
Patient is calling back because he hasn't heard anything about some meds being sent in for him.

## 2011-10-24 NOTE — Telephone Encounter (Signed)
Told that since he had just finished a round of antibiotics, I would not recommend antibiotics at this time for his purulent rhinorrhea.  Also, he has been unable to nicotine patch yet.  Called the pharmacy and they found a brand that would be covered.  Informed patient.

## 2011-10-25 ENCOUNTER — Telehealth: Payer: Self-pay | Admitting: Family Medicine

## 2011-10-25 MED ORDER — OXYMETAZOLINE HCL 0.05 % NA SOLN: 2.0000 | Freq: Two times a day (BID) | NASAL | Status: AC

## 2011-10-25 MED ORDER — AMOXICILLIN-POT CLAVULANATE 875-125 MG PO TABS: 1.0000 | ORAL_TABLET | Freq: Two times a day (BID) | ORAL | Status: AC

## 2011-10-25 NOTE — Telephone Encounter (Signed)
Was given abx for URI and sinus infection this week.  It has cleared his URI up, but his eyes are still swollen and sinuses "giving him a fit" pls advise

## 2011-10-25 NOTE — Telephone Encounter (Signed)
Patient was given antibiotic on 04/10 and has finished. Chest congestion has cleared. Now has yellow nasal secretions and sputum. No fever. Eyes swollen.  Dr. Leveda Anna not available , out of office. Will forward to preceptor.

## 2011-10-25 NOTE — Telephone Encounter (Signed)
Pain beneath both eyes into jaw, eyes almost swollen shut. Hasn't measured a fever. I recommended otc Afrin bid for 3 days only and Augmentin was ordered.

## 2012-04-13 ENCOUNTER — Telehealth: Payer: Self-pay | Admitting: Family Medicine

## 2012-04-13 NOTE — Telephone Encounter (Signed)
Patient dropped off form to be filled out for [facility].  Please fax when completed.

## 2012-04-14 NOTE — Telephone Encounter (Signed)
Completed and given to Donna Loring.  

## 2012-04-15 NOTE — Telephone Encounter (Signed)
DMV forms faxed to [phone] per Draden's request.  Ileana Ladd

## 2012-07-22 ENCOUNTER — Ambulatory Visit: Payer: PRIVATE HEALTH INSURANCE | Admitting: Family Medicine

## 2012-08-05 ENCOUNTER — Ambulatory Visit: Payer: PRIVATE HEALTH INSURANCE | Admitting: Family Medicine

## 2012-08-10 ENCOUNTER — Other Ambulatory Visit: Payer: Self-pay | Admitting: Family Medicine

## 2012-08-26 ENCOUNTER — Ambulatory Visit: Payer: PRIVATE HEALTH INSURANCE | Admitting: Family Medicine

## 2013-01-13 ENCOUNTER — Telehealth: Payer: Self-pay | Admitting: Family Medicine

## 2013-01-13 NOTE — Telephone Encounter (Signed)
Pt is wanting to talk to Dr. Leveda Anna concerning that [facility] form states that he still can not drive without restrictions and wants to know why. JW

## 2013-01-14 ENCOUNTER — Telehealth: Payer: Self-pay | Admitting: Family Medicine

## 2013-01-14 NOTE — Telephone Encounter (Signed)
Pt called to let Dr. Leveda Anna know that he does not have to call the DMV. There was a mistake and it is fixed now. JW

## 2013-01-14 NOTE — Telephone Encounter (Signed)
Please advise. [name]  

## 2013-01-14 NOTE — Telephone Encounter (Signed)
Called patient.  He tells me that the reason he still has a restricted license is because of a form I filled out and that I recommended the restriction continue.  I have no recollection of this and I cannot find a copy of any form in our records.  He asked that I call the [facility] Medical Review Board [phone].

## 2013-01-25 ENCOUNTER — Telehealth: Payer: Self-pay | Admitting: Family Medicine

## 2013-01-25 NOTE — Telephone Encounter (Signed)
Pt is requesting that Dr. Leveda Anna call him on a personal matter JW

## 2013-01-25 NOTE — Telephone Encounter (Signed)
Pt would like a list of his diagnoses and what medications he takes for them.  I will fwd to MD for review.  [name], Darlyne Russian, CMA

## 2013-01-28 ENCOUNTER — Encounter: Payer: Self-pay | Admitting: Family Medicine

## 2013-01-28 NOTE — Telephone Encounter (Signed)
Provided letter as requested

## 2013-03-12 ENCOUNTER — Ambulatory Visit: Payer: PRIVATE HEALTH INSURANCE | Admitting: Family Medicine

## 2013-03-31 ENCOUNTER — Ambulatory Visit (INDEPENDENT_AMBULATORY_CARE_PROVIDER_SITE_OTHER): Payer: PRIVATE HEALTH INSURANCE | Admitting: Family Medicine

## 2013-03-31 ENCOUNTER — Ambulatory Visit (HOSPITAL_COMMUNITY)
Admission: RE | Admit: 2013-03-31 | Discharge: 2013-03-31 | Disposition: A | Discharge status: Home / Self Care | Payer: PRIVATE HEALTH INSURANCE | Source: Ambulatory Visit | Attending: Family Medicine | Admitting: Family Medicine

## 2013-03-31 ENCOUNTER — Encounter: Payer: Self-pay | Admitting: Family Medicine

## 2013-03-31 ENCOUNTER — Ambulatory Visit
Admission: RE | Admit: 2013-03-31 | Discharge: 2013-03-31 | Disposition: A | Discharge status: Home / Self Care | Payer: PRIVATE HEALTH INSURANCE | Source: Ambulatory Visit | Attending: Family Medicine | Admitting: Family Medicine

## 2013-03-31 VITALS — BP 124/73 | HR 73 | Temp 98.3°F | Ht 67.0 in | Wt 159.0 lb

## 2013-03-31 DIAGNOSIS — R079 Chest pain, unspecified: Secondary | ICD-10-CM | POA: Insufficient documentation

## 2013-03-31 DIAGNOSIS — F17200 Nicotine dependence, unspecified, uncomplicated: Secondary | ICD-10-CM

## 2013-03-31 DIAGNOSIS — I10 Essential (primary) hypertension: Secondary | ICD-10-CM

## 2013-03-31 DIAGNOSIS — Z85528 Personal history of other malignant neoplasm of kidney: Secondary | ICD-10-CM

## 2013-03-31 DIAGNOSIS — Z23 Encounter for immunization: Secondary | ICD-10-CM

## 2013-03-31 DIAGNOSIS — J449 Chronic obstructive pulmonary disease, unspecified: Secondary | ICD-10-CM

## 2013-03-31 DIAGNOSIS — F319 Bipolar disorder, unspecified: Secondary | ICD-10-CM

## 2013-03-31 DIAGNOSIS — J4489 Other specified chronic obstructive pulmonary disease: Secondary | ICD-10-CM

## 2013-03-31 LAB — COMPLETE METABOLIC PANEL WITH GFR
ALT: 22 U/L (ref 0–53)
CO2: 29 mEq/L (ref 19–32)
GFR, Est African American: 67 mL/min
Potassium: 4.4 mEq/L (ref 3.5–5.3)
Sodium: 140 mEq/L (ref 135–145)
Total Bilirubin: 0.5 mg/dL (ref 0.3–1.2)
Total Protein: 6.8 g/dL (ref 6.0–8.3)

## 2013-03-31 LAB — CBC
MCH: 32.9 pg (ref 26.0–34.0)
Platelets: 287 10*3/uL (ref 150–400)
RBC: 5.1 MIL/uL (ref 4.22–5.81)
WBC: 5.3 10*3/uL (ref 4.0–10.5)

## 2013-03-31 LAB — POCT URINALYSIS DIPSTICK
Blood, UA: NEGATIVE
Glucose, UA: NEGATIVE
Leukocytes, UA: NEGATIVE
Nitrite, UA: NEGATIVE
Urobilinogen, UA: 1

## 2013-03-31 NOTE — Assessment & Plan Note (Signed)
Will check UA to look for hematuria.

## 2013-03-31 NOTE — Assessment & Plan Note (Signed)
LIkely related to COPD and or anxiety.  No red flag symptoms of cancer which is his worry.  Doubt cardiac given non exertional nature.  Needs to quit smoking.

## 2013-03-31 NOTE — Progress Notes (Signed)
  Subjective:    Patient ID: [name], male    DOB: 10-Aug-1950, 62 y.o.   MRN: [id number]  HPI [phone] Complains of non exertional chest pain.  Very concerned about cancer.  Asking for a chest CT.  Continues to smoke.  Hx of renal cancer thought to be in remission/cured.  Needs flu and pneumonia vaccine due to COPD. Denies SOB or wheezing.  Denies palpitations, nausea or diaphoresis.    Review of Systems     Objective:   Physical Exam Lungs, prolonged exp phase, no wheeze.  Increased AP diameter. Cardiac RRR without m or g Abd benign No chest wall tenderness.        Assessment & Plan:

## 2013-03-31 NOTE — Assessment & Plan Note (Signed)
Needs to quit.  

## 2013-03-31 NOTE — Assessment & Plan Note (Signed)
Other than chest pain, asymptomatic

## 2013-03-31 NOTE — Assessment & Plan Note (Signed)
Well controled. 

## 2013-03-31 NOTE — Patient Instructions (Signed)
I will call Monday with results Quit smoking.  Good seeing you I am glad you are concerned about your health.  Do what you need to do.

## 2013-03-31 NOTE — Assessment & Plan Note (Signed)
Has trouble handling anxiety.

## 2013-08-10 ENCOUNTER — Telehealth: Payer: Self-pay | Admitting: Family Medicine

## 2013-08-10 DIAGNOSIS — F319 Bipolar disorder, unspecified: Secondary | ICD-10-CM

## 2013-08-10 MED ORDER — QUETIAPINE FUMARATE 400 MG PO TABS: ORAL_TABLET | ORAL | Status: DC

## 2013-08-10 NOTE — Telephone Encounter (Signed)
Patient notified that refill sent to [facility].

## 2013-08-10 NOTE — Telephone Encounter (Signed)
Needs refill on seroquel

## 2013-08-10 NOTE — Assessment & Plan Note (Signed)
Refill as requested 

## 2013-09-24 ENCOUNTER — Telehealth: Payer: Self-pay

## 2014-04-27 ENCOUNTER — Encounter: Payer: Self-pay | Admitting: *Deleted

## 2014-04-27 NOTE — Progress Notes (Signed)
Prior Authorization received from [facility] for Quetiapine 400 mg tab. Per [facility] and [facility] a Safety DOC (ASAP) form needed to be completed.  PA completed by Dr. Nori Riis.  PA pending; confirmation number [id number] W.   Derl Barrow, RN

## 2014-04-28 NOTE — Progress Notes (Signed)
PA denied for Quetiapine 400 mg per [facility].  [facility] aware.  Derl Barrow, RN

## 2014-06-19 ENCOUNTER — Encounter (HOSPITAL_COMMUNITY): Payer: Self-pay

## 2014-06-19 ENCOUNTER — Encounter (HOSPITAL_COMMUNITY): Payer: Self-pay | Admitting: *Deleted

## 2014-06-19 ENCOUNTER — Observation Stay (HOSPITAL_COMMUNITY)
Admission: AD | Admit: 2014-06-19 | Discharge: 2014-06-20 | Disposition: A | Discharge status: Home / Self Care | Payer: PRIVATE HEALTH INSURANCE | Source: Intra-hospital | Attending: Nurse Practitioner | Admitting: Nurse Practitioner

## 2014-06-19 ENCOUNTER — Emergency Department (HOSPITAL_COMMUNITY)
Admission: EM | Admit: 2014-06-19 | Discharge: 2014-06-19 | Disposition: A | Discharge status: Other Acute Inpatient Hospital | Payer: PRIVATE HEALTH INSURANCE | Source: Home / Self Care | Attending: Emergency Medicine | Admitting: Emergency Medicine

## 2014-06-19 DIAGNOSIS — F17210 Nicotine dependence, cigarettes, uncomplicated: Secondary | ICD-10-CM | POA: Insufficient documentation

## 2014-06-19 DIAGNOSIS — F332 Major depressive disorder, recurrent severe without psychotic features: Secondary | ICD-10-CM | POA: Diagnosis not present

## 2014-06-19 DIAGNOSIS — F1020 Alcohol dependence, uncomplicated: Secondary | ICD-10-CM

## 2014-06-19 DIAGNOSIS — Z885 Allergy status to narcotic agent status: Secondary | ICD-10-CM | POA: Insufficient documentation

## 2014-06-19 DIAGNOSIS — F1410 Cocaine abuse, uncomplicated: Secondary | ICD-10-CM | POA: Insufficient documentation

## 2014-06-19 DIAGNOSIS — Z79899 Other long term (current) drug therapy: Secondary | ICD-10-CM | POA: Insufficient documentation

## 2014-06-19 DIAGNOSIS — F419 Anxiety disorder, unspecified: Secondary | ICD-10-CM

## 2014-06-19 DIAGNOSIS — Z720 Tobacco use: Secondary | ICD-10-CM

## 2014-06-19 DIAGNOSIS — F319 Bipolar disorder, unspecified: Secondary | ICD-10-CM

## 2014-06-19 LAB — COMPREHENSIVE METABOLIC PANEL
ALBUMIN: 3.6 g/dL (ref 3.5–5.2)
ALK PHOS: 100 U/L (ref 39–117)
ALT: 13 U/L (ref 0–53)
AST: 15 U/L (ref 0–37)
Anion gap: 11 (ref 5–15)
BILIRUBIN TOTAL: 0.6 mg/dL (ref 0.3–1.2)
BUN: 8 mg/dL (ref 6–23)
CHLORIDE: 101 meq/L (ref 96–112)
CO2: 26 meq/L (ref 19–32)
CREATININE: 1.14 mg/dL (ref 0.50–1.35)
Calcium: 9.4 mg/dL (ref 8.4–10.5)
GFR calc Af Amer: 78 mL/min — ABNORMAL LOW (ref >90)
GFR, EST NON AFRICAN AMERICAN: 68 mL/min — AB (ref >90)
Glucose, Bld: 99 mg/dL (ref 70–99)
POTASSIUM: 4.6 meq/L (ref 3.7–5.3)
SODIUM: 138 meq/L (ref 137–147)
Total Protein: 7.1 g/dL (ref 6.0–8.3)

## 2014-06-19 LAB — CBC
HCT: 49.6 % (ref 39.0–52.0)
Hemoglobin: 17 g/dL (ref 13.0–17.0)
MCH: 32.4 pg (ref 26.0–34.0)
MCHC: 34.3 g/dL (ref 30.0–36.0)
MCV: 94.5 fL (ref 78.0–100.0)
PLATELETS: 282 10*3/uL (ref 150–400)
RBC: 5.25 MIL/uL (ref 4.22–5.81)
RDW: 14.2 % (ref 11.5–15.5)
WBC: 5 10*3/uL (ref 4.0–10.5)

## 2014-06-19 LAB — ETHANOL

## 2014-06-19 LAB — RAPID URINE DRUG SCREEN, HOSP PERFORMED
AMPHETAMINES: NOT DETECTED
BARBITURATES: NOT DETECTED
BENZODIAZEPINES: NOT DETECTED
Cocaine: POSITIVE — AB
Opiates: NOT DETECTED
TETRAHYDROCANNABINOL: NOT DETECTED

## 2014-06-19 LAB — TROPONIN I

## 2014-06-19 MED ORDER — NICOTINE 21 MG/24HR TD PT24
MEDICATED_PATCH | TRANSDERMAL | Status: AC
  Administered 2014-06-19: 19:00:00
  Filled 2014-06-19: qty 1

## 2014-06-19 MED ORDER — LORAZEPAM 1 MG PO TABS: 0.0000 mg | ORAL_TABLET | Freq: Two times a day (BID) | ORAL | Status: DC

## 2014-06-19 MED ORDER — ACETAMINOPHEN 325 MG PO TABS: 650.0000 mg | ORAL_TABLET | Freq: Four times a day (QID) | ORAL | Status: DC | PRN

## 2014-06-19 MED ORDER — NICOTINE 21 MG/24HR TD PT24
21.0000 mg | MEDICATED_PATCH | Freq: Every day | TRANSDERMAL | Status: DC
  Administered 2014-06-19: 21 mg via TRANSDERMAL
  Filled 2014-06-19: qty 1

## 2014-06-19 MED ORDER — AMANTADINE HCL 100 MG PO CAPS
100.0000 mg | ORAL_CAPSULE | Freq: Two times a day (BID) | ORAL | Status: DC
  Administered 2014-06-19 – 2014-06-20 (×2): 100 mg via ORAL
  Filled 2014-06-19 (×6): qty 1

## 2014-06-19 MED ORDER — LORAZEPAM 1 MG PO TABS
0.0000 mg | ORAL_TABLET | Freq: Four times a day (QID) | ORAL | Status: DC
  Administered 2014-06-19: 1 mg via ORAL
  Filled 2014-06-19: qty 1

## 2014-06-19 MED ORDER — VITAMIN B-1 100 MG PO TABS
100.0000 mg | ORAL_TABLET | Freq: Every day | ORAL | Status: DC
  Administered 2014-06-20: 100 mg via ORAL
  Filled 2014-06-19 (×3): qty 1

## 2014-06-19 MED ORDER — THIAMINE HCL 100 MG/ML IJ SOLN: 100.0000 mg | Freq: Every day | INTRAMUSCULAR | Status: DC

## 2014-06-19 MED ORDER — QUETIAPINE FUMARATE 400 MG PO TABS
400.0000 mg | ORAL_TABLET | Freq: Every day | ORAL | Status: DC
  Administered 2014-06-19: 400 mg via ORAL
  Filled 2014-06-19: qty 1
  Filled 2014-06-19: qty 2
  Filled 2014-06-19: qty 1

## 2014-06-19 MED ORDER — MAGNESIUM HYDROXIDE 400 MG/5ML PO SUSP: 30.0000 mL | Freq: Every day | ORAL | Status: DC | PRN

## 2014-06-19 MED ORDER — VITAMIN B-1 100 MG PO TABS
100.0000 mg | ORAL_TABLET | Freq: Every day | ORAL | Status: DC
  Administered 2014-06-19: 100 mg via ORAL
  Filled 2014-06-19: qty 1

## 2014-06-19 MED ORDER — LORAZEPAM 1 MG PO TABS
0.0000 mg | ORAL_TABLET | Freq: Four times a day (QID) | ORAL | Status: DC
  Administered 2014-06-19: 2 mg via ORAL
  Filled 2014-06-19: qty 2

## 2014-06-19 MED ORDER — ALUM & MAG HYDROXIDE-SIMETH 200-200-20 MG/5ML PO SUSP: 30.0000 mL | ORAL | Status: DC | PRN

## 2014-06-19 NOTE — ED Notes (Signed)
I have just called report to Maudie Mercury, RN in [facility] obs.; and we have just notified [facility] for transport.

## 2014-06-19 NOTE — Plan of Care (Signed)
[facility] Observation Crisis Plan  Reason for Crisis Plan:  Substance Abuse   Plan of Care:  Referral for Substance Abuse  Family Support:      Current Living Environment:  Living Arrangements: Alone  Insurance:   Hospital Account    Name Acct ID Class Status Primary Coverage   [name], [name] [id number] [facility] - [place]        Guarantor Account (for Hospital Account [id number])    Name Relation to Pt Service Area Active? Acct Type   [name] [facility]   Address Phone       [address] [place], [place] [address] [phone])          Coverage Information (for Hospital Account [id number])    F/O Payor/Plan Precert #   [place] #   Martin, Gray [id number]   Address Phone   [place] [address] [phone]      Legal Guardian:     Primary Care Provider:  No primary care provider on file.  Current Outpatient Providers:  none  Psychiatrist:     Counselor/Therapist:     Compliant with Medications:  Yes  Additional Information: Pt was admitted to the obs unit after he presented to [facility] requesting detox from alcohol and cocaine/crack.     [name] 12/13/20155:24 PM

## 2014-06-19 NOTE — BH Assessment (Signed)
Assessment Note  [name] is an 63 y.o. male. Patient was brought into the ED by his sister because of alcohol and cocaine dependence.  Patient reports using alcohol and cocaine off/on for the patient 30 or more years.  Patient reports using crack daily, $20-120, last used Saturday $800. Patient reports drinking alcohol daily, 1-4 40oz beer and last used on Saturday an unknown amount but he reports a lot.  Patient currently denies SI/HI, hallucinations, and other self-injurious behaviors.  Patient reports a history of withdrawal such as panic attacks, decreased sleep, depression, nausea, and vomiting. Patient denies history of seizures or blackouts.    Axis I: Cocaine use moderate, Alcohol use moderate, and Mood D/O NOS Axis II: Deferred Axis III: No past medical history on file. Axis IV: economic problems, housing problems, occupational problems, other psychosocial or environmental problems, problems related to legal system/crime, problems related to social environment, problems with access to health care services and problems with primary support group Axis V: 41-50 serious symptoms  Past Medical History: No past medical history on file.  No past surgical history on file.  Family History: History reviewed. No pertinent family history.  Social History:  reports that he has been smoking.  He does not have any smokeless tobacco history on file. He reports that he drinks alcohol. He reports that he uses illicit drugs.  Additional Social History:     CIWA: CIWA-Ar BP: 123/84 mmHg Pulse Rate: 84 Nausea and Vomiting: mild nausea with no vomiting Tactile Disturbances: none Tremor: two Auditory Disturbances: not present Paroxysmal Sweats: no sweat visible Visual Disturbances: not present Anxiety: three Headache, Fullness in Head: none present Agitation: somewhat more than normal activity Orientation and Clouding of Sensorium: cannot do serial additions or is uncertain about  date CIWA-Ar Total: 8 COWS:    Allergies:  Allergies  Allergen Reactions  . Codeine     REACTION: nausea    Home Medications:  (Not in a hospital admission)  OB/GYN Status:  No LMP for male patient.  General Assessment Data Location of Assessment: [facility] ED ACT Assessment: Yes Is this a Tele or Face-to-Face Assessment?: Face-to-Face Is this an Initial Assessment or a Re-assessment for this encounter?: Initial Assessment Living Arrangements: Other relatives Can pt return to current living arrangement?: Yes Admission Status: Voluntary Is patient capable of signing voluntary admission?: Yes Transfer from: Home Referral Source: Self/Family/Friend  Medical Screening Exam ([place]) Medical Exam completed: Yes  [facility] Crisis Care Plan Living Arrangements: Other relatives Name of Psychiatrist: none Name of Therapist: none  Education Status Is patient currently in school?: No  Risk to self with the past 6 months Suicidal Ideation: No-Not Currently/Within Last 6 Months Suicidal Intent: No-Not Currently/Within Last 6 Months Is patient at risk for suicide?: No Suicidal Plan?: No-Not Currently/Within Last 6 Months Access to Means: No What has been your use of drugs/alcohol within the last 12 months?: alcohol, crack Previous Attempts/Gestures: No Intentional Self Injurious Behavior: None Family Suicide History: Yes (uncle, cousin) Recent stressful life event(s): Conflict (Comment), Loss (Comment), Financial Problems, Legal Issues (SA) Persecutory voices/beliefs?: No Depression: Yes Depression Symptoms: Tearfulness, Isolating, Guilt, Loss of interest in usual pleasures, Feeling worthless/self pity, Feeling angry/irritable Substance abuse history and/or treatment for substance abuse?: Yes  Risk to Others within the past 6 months Homicidal Ideation: No-Not Currently/Within Last 6 Months Thoughts of Harm to Others: No-Not Currently Present/Within Last 6 Months Current  Homicidal Intent: No-Not Currently/Within Last 6 Months Current Homicidal Plan: No-Not Currently/Within Last  6 Months Access to Homicidal Means: No History of harm to others?: No Assessment of Violence: None Noted Does patient have access to weapons?: No Criminal Charges Pending?: Yes Describe Pending Criminal Charges: Larceny Does patient have a court date: Yes Court Date: 06/23/14  Psychosis Hallucinations: None noted Delusions: None noted  Mental Status Report Appear/Hygiene: Disheveled Eye Contact: Fair Motor Activity: Restlessness Speech: Pressured Level of Consciousness: Alert Mood: Anxious Affect: Anxious Anxiety Level: Moderate Thought Processes: Coherent Judgement: Unimpaired Orientation: Person, Place, Time, Situation Obsessive Compulsive Thoughts/Behaviors: None  Cognitive Functioning Concentration: Fair Memory: Recent Intact, Remote Intact IQ: Average Insight: Poor Impulse Control: Poor Appetite: Fair Sleep: Decreased Vegetative Symptoms: None  ADLScreening [facility] Assessment Services) Patient's cognitive ability adequate to safely complete daily activities?: Yes Patient able to express need for assistance with ADLs?: Yes Independently performs ADLs?: Yes (appropriate for developmental age)  Prior Inpatient Therapy Prior Inpatient Therapy: Yes Prior Therapy Facilty/Provider(s): [facility], [facility], [facility], [facility] Reason for Treatment: SA, Depression  Prior Outpatient Therapy Prior Outpatient Therapy: Yes Prior Therapy Facilty/Provider(s): [facility] Reason for Treatment: SA  ADL Screening (condition at time of admission) Patient's cognitive ability adequate to safely complete daily activities?: Yes Patient able to express need for assistance with ADLs?: Yes Independently performs ADLs?: Yes (appropriate for developmental age)             Advance Directives (For Healthcare) Does patient have an advance directive?: No Would patient like information on  creating an advanced directive?: No - patient declined information    Additional Information 1:1 In Past 12 Months?: No CIRT Risk: No Elopement Risk: No Does patient have medical clearance?: Yes     Disposition:  Disposition Initial Assessment Completed for this Encounter: Yes Disposition of Patient: Inpatient treatment program Type of inpatient treatment program: Adult  On Site Evaluation by:   Reviewed with Physician:    Chesley Noon A 06/19/2014 3:01 PM

## 2014-06-19 NOTE — ED Provider Notes (Signed)
CSN: [id number]     Arrival date & time 06/19/14  0932 History   First MD Initiated Contact with Patient 06/19/14 1158     Chief Complaint  Patient presents with  . Addiction Problem     (Consider location/radiation/quality/duration/timing/severity/associated sxs/prior Treatment) The history is provided by the patient.   patient is requesting detox off of alcohol and cocaine. States her use up to $800 cocaine a day. States he'll also drink 3 or 4 40 oz beers. He states it is starting to run his life. He states he is going to die if he keeps using. He denies suicidal thoughts. He states he last used 2 days ago. He denies other drug use. He does have previous history of injection use of cocaine and meth, but not recently. No fevers. No chest pain. States she is very anxious at this time.  No past medical history on file. No past surgical history on file. History reviewed. No pertinent family history. History  Substance Use Topics  . Smoking status: Current Every Day Smoker -- 1.00 packs/day  . Smokeless tobacco: Not on file     Comment: wants to discuss today  . Alcohol Use: Yes    Review of Systems  Constitutional: Negative for activity change and appetite change.  Eyes: Negative for pain.  Respiratory: Negative for chest tightness and shortness of breath.   Cardiovascular: Negative for chest pain and leg swelling.  Gastrointestinal: Negative for nausea, vomiting, abdominal pain and diarrhea.  Genitourinary: Negative for flank pain.  Musculoskeletal: Negative for back pain and neck stiffness.  Skin: Negative for rash.  Neurological: Negative for weakness, numbness and headaches.  Psychiatric/Behavioral: Positive for dysphoric mood. Negative for suicidal ideas and behavioral problems. The patient is nervous/anxious.       Allergies  Codeine  Home Medications   Prior to Admission medications   Medication Sig Start Date End Date Taking? Authorizing Provider  acetaminophen  (TYLENOL) 500 MG tablet Take 500 mg by mouth every 4 (four) hours as needed. For pain     Yes Historical Provider, MD  QUEtiapine (SEROQUEL) 400 MG tablet TAKE 1 TABLET BY MOUTH TWICE DAILY Patient taking differently: Take 400 mg by mouth at bedtime. TAKE 1 TABLET BY MOUTH TWICE DAILY 08/10/13  Yes Zigmund Gottron, MD  albuterol (VENTOLIN HFA) 108 (90 BASE) MCG/ACT inhaler Inhale 2 puffs into the lungs every 4 (four) hours as needed. For shortness of breath or wheezing. 10/16/11   Zigmund Gottron, MD  divalproex (DEPAKOTE) 500 MG EC tablet Take 1,000 mg by mouth at bedtime.      Historical Provider, MD  gabapentin (NEURONTIN) 300 MG capsule Take 300 mg by mouth 3 (three) times daily. Need to verify dose.  Given by [facility] Provider, MD   BP 136/96 mmHg  Pulse 89  Temp(Src) 97.3 F (36.3 C) (Oral)  Resp 17  SpO2 99% Physical Exam  Constitutional: He is oriented to person, place, and time. He appears well-developed and well-nourished.  HENT:  Head: Normocephalic and atraumatic.  Eyes: EOM are normal. Pupils are equal, round, and reactive to light.  Neck: Normal range of motion. Neck supple.  Cardiovascular: Normal rate, regular rhythm and normal heart sounds.   No murmur heard. Pulmonary/Chest: Effort normal and breath sounds normal.  Abdominal: Soft. Bowel sounds are normal. He exhibits no distension and no mass. There is no tenderness. There is no rebound and no guarding.  Musculoskeletal: Normal range of motion. He  exhibits no edema.  Neurological: He is alert and oriented to person, place, and time. No cranial nerve deficit.  Skin: Skin is warm and dry.  Psychiatric: He has a normal mood and affect.  Nursing note and vitals reviewed.   ED Course  Procedures (including critical care time) Labs Review Labs Reviewed  COMPREHENSIVE METABOLIC PANEL - Abnormal; Notable for the following:    GFR calc non Af Amer 68 (*)    GFR calc Af Amer 78 (*)    All  other components within normal limits  URINE RAPID DRUG SCREEN (HOSP PERFORMED) - Abnormal; Notable for the following:    Cocaine POSITIVE (*)    All other components within normal limits  CBC  ETHANOL  TROPONIN I    Imaging Review No results found.   EKG Interpretation   Date/Time:  Sunday June 19 2014 12:03:17 EST Ventricular Rate:  93 PR Interval:  160 QRS Duration: 93 QT Interval:  362 QTC Calculation: 450 R Axis:   -83 Text Interpretation:  Sinus rhythm Left anterior fascicular block ST  elevation suggests acute pericarditis No significant change since last  tracing Confirmed by Alvino Chapel  MD, Ovid Curd [phone]) on 06/19/2014 12:55:26  PM      MDM   Final diagnoses:  Alcoholism  Cocaine abuse    Patient presents for treatment for substance abuse. Alcohol and cocaine. He is very tearful on my exam. He appears to be medically cleared at this time. We'll start on CIWA protocol and get TTS evaluation.    Jasper Riling. Alvino Chapel, MD 06/19/14 1302

## 2014-06-19 NOTE — Progress Notes (Signed)
Patient ID: [name], male   DOB: May 02, 1951, 63 y.o.   MRN: [id number]    63 year old white male admitted to the observation unit after he presented to [facility] requesting detox from alcohol and crack/cocaine. Pt reported that he uses $800 dollars of crack/cocaine daily and that he would lie steal and cheat to get it. Pt reported that he also drinks daily and that he can drink anywhere from one beer to forty beers daily. Pt reported being negative SI/HI, no AH/VH noted. Pt did report some withdrawal symptoms like irritability, agitated, pain all over, and tremors.

## 2014-06-19 NOTE — Progress Notes (Signed)
Per Otila Kluver, [facility] patient accepted to OBS-4 and can be transferred.  CSW informed nursing staff of the updated disposition.    Chesley Noon, MSW, [place] Clinical Social Worker [phone]

## 2014-06-19 NOTE — ED Notes (Signed)
He tearfully tells me he has recently had  "ralapse and I started abusing alcohol and crack cocaine--I need help".  He denies h.i/s.i.

## 2014-06-19 NOTE — Progress Notes (Signed)
Patient was seen standing in front of the T.V Stated that  he cannot hear the television from his bed. He was offered to sit on bed 1 near the TV till he's ready to go to bed. Patient stand up occasionally, paced the unit and sit back. Patient is blunt and argumentative with staff at times. Denies pain, SI. AH/VH. Patient stated "I am fine, If anything comes up with me, I will let you know. I am not a coward; I am here to seek help. I will let you know". Offered refreshment. Accepted his medications. Safety maintained. Will continue to monitor patient.

## 2014-06-20 DIAGNOSIS — F1994 Other psychoactive substance use, unspecified with psychoactive substance-induced mood disorder: Secondary | ICD-10-CM

## 2014-06-20 DIAGNOSIS — F1020 Alcohol dependence, uncomplicated: Secondary | ICD-10-CM | POA: Diagnosis not present

## 2014-06-20 DIAGNOSIS — F1010 Alcohol abuse, uncomplicated: Secondary | ICD-10-CM

## 2014-06-20 DIAGNOSIS — F332 Major depressive disorder, recurrent severe without psychotic features: Secondary | ICD-10-CM

## 2014-06-20 MED ORDER — GABAPENTIN 300 MG PO CAPS: 300.0000 mg | ORAL_CAPSULE | Freq: Three times a day (TID) | ORAL | Status: DC

## 2014-06-20 MED ORDER — QUETIAPINE FUMARATE 400 MG PO TABS: ORAL_TABLET | ORAL | Status: DC

## 2014-06-20 MED ORDER — DIVALPROEX SODIUM 500 MG PO DR TAB: 1000.0000 mg | DELAYED_RELEASE_TABLET | Freq: Every day | ORAL | Status: DC

## 2014-06-20 NOTE — H&P (Signed)
[place] OBS UNIT H&P   Subjective: Pt seen and chart reviewed. He spent the night in [facility] OBS Unit without incident. His BAL upon admission to the ED was: <11. His last reported alcohol was 48 hours ago on Saturday 06/18/2014. His most recent CIWA scores are: 10, 6, 7, but the only rating for the one this morning was 7 points for agitation and no other withdrawal symptoms whatsoever.   Pt denies SI, HI, and AVH, contracts for safety. His goal is to do inpatient rehab for alcohol and we will send referrals, but pt will be discharged today if we do not find a bed by approximately 4:00PM, which is the 23 hour mark.    HPI:  [name] is an 62 y.o. male. Patient was brought into the ED by his sister because of alcohol and cocaine dependence.  Patient reports using alcohol and cocaine off/on for the patient 30 or more years.  Patient reports using crack daily, $20-120, last used Saturday $800. Patient reports drinking alcohol daily, 1-4 40oz beer and last used on Saturday an unknown amount but he reports a lot.  Patient currently denies SI/HI, hallucinations, and other self-injurious behaviors.  Patient reports a history of withdrawal such as panic attacks, decreased sleep, depression, nausea, and vomiting. Patient denies history of seizures or blackouts.    Axis I: Alcohol Abuse, Major Depression, Recurrent severe, Substance Abuse and Substance Induced Mood Disorder Axis II: Deferred Axis III: History reviewed. No pertinent past medical history. Axis IV: economic problems, housing problems, occupational problems, other psychosocial or environmental problems, problems related to legal system/crime, problems related to social environment, problems with access to health care services and problems with primary support group Axis V: 51-60 moderate symptoms  Psychiatric Specialty Exam: Physical Exam  ROS  Blood pressure 104/83, pulse 70, temperature 97.8 F (36.6 C), temperature source Oral, resp.  rate 16, height 5\' 7"  (1.702 m), weight 70.308 kg (155 lb).Body mass index is 24.27 kg/(m^2).  General Appearance: Casual and Fairly Groomed  Engineer, water::  Good  Speech:  Clear and Coherent and Normal Rate  Volume:  Normal  Mood:  Anxious and Depressed  Affect:  Depressed  Thought Process:  Coherent and Goal Directed  Orientation:  Full (Time, Place, and Person)  Thought Content:  WDL  Suicidal Thoughts:  No  Homicidal Thoughts:  No  Memory:  Immediate;   Fair Recent;   Fair Remote;   Fair  Judgement:  Fair  Insight:  Fair  Psychomotor Activity:  Normal  Concentration:  Good  Recall:  Fair  Akathisia:  No  Handed:    AIMS (if indicated):     Assets:  Communication Skills Desire for Improvement Housing Resilience Social Support  Sleep:  Number of Hours: 7      Past Medical History: History reviewed. No pertinent past medical history.  History reviewed. No pertinent past surgical history.  Family History: History reviewed. No pertinent family history.  Social History:  reports that he has been smoking.  He does not have any smokeless tobacco history on file. He reports that he drinks alcohol. He reports that he uses illicit drugs.  Additional Social History:  Alcohol / Drug Use Pain Medications: none noted Prescriptions: none noted Over the Counter: noted noted History of alcohol / drug use?: Yes Negative Consequences of Use: Financial Withdrawal Symptoms: Agitation, Irritability, Tremors, Weakness  CIWA: CIWA-Ar BP: (!) 76/50 mmHg (nurse notified of low blood pressure) Pulse Rate: (!) 108 Nausea and  Vomiting: no nausea and no vomiting Tactile Disturbances: none Tremor: no tremor Auditory Disturbances: not present Paroxysmal Sweats: no sweat visible Visual Disturbances: not present Anxiety: no anxiety, at ease Headache, Fullness in Head: none present Agitation: paces back and forth during most of the interview, or constantly thrashes about Orientation and  Clouding of Sensorium: oriented and can do serial additions CIWA-Ar Total: 7 COWS:    Allergies:  Allergies  Allergen Reactions  . Codeine     REACTION: nausea    Home Medications:  Medications Prior to Admission  Medication Sig Dispense Refill  . QUEtiapine (SEROQUEL) 400 MG tablet TAKE 1 TABLET BY MOUTH TWICE DAILY (Patient taking differently: Take 400 mg by mouth at bedtime. TAKE 1 TABLET BY MOUTH TWICE DAILY) 60 tablet 12  . acetaminophen (TYLENOL) 500 MG tablet Take 500 mg by mouth every 4 (four) hours as needed. For pain      . albuterol (VENTOLIN HFA) 108 (90 BASE) MCG/ACT inhaler Inhale 2 puffs into the lungs every 4 (four) hours as needed. For shortness of breath or wheezing. 1 Inhaler 2  . divalproex (DEPAKOTE) 500 MG EC tablet Take 1,000 mg by mouth at bedtime.      . gabapentin (NEURONTIN) 300 MG capsule Take 300 mg by mouth 3 (three) times daily. Need to verify dose.  Given by [facility]       OB/GYN Status:  No LMP for male patient.  Disposition:  Seek inpatient alcohol rehab per patient request. However, BAL was below detectable limits so this will be a challenge. If no bed found by 4:00PM, pt will discharge to follow up at home if BP is stable.    Benjamine Mola, FNP-BC 06/20/2014 8:46 AM

## 2014-06-20 NOTE — Progress Notes (Signed)
Patient ID: [name], male   DOB: 1950/07/31, 63 y.o.   MRN: [id number] D-States he needs and wants long term treatment from here. He is flat, minimally verbal, takes things like phone and remote rather than ask for them and is irritable.Not a behavior problem. He was seen this am by Heloise Purpura and it has been determined he will be discharged sometime today. Client then said "we might as well give him a gun to put to his head if we send him out because he knows what he will do" Told social worker would work with him to find a placement and that threats of suicide dont always help since many of the treatment centers wont take you if you are suicidal.  A-Support offered. Monitored for safety. Redirected as needed. Medications as ordered. R-Waiting on Education officer, museum to find a placement for him. He lives with his sister and states he can return there. He has used the phone multiple times with unknown callers. He is cooperative. Other than sweating he has no sx of withdrawal.

## 2014-06-20 NOTE — Progress Notes (Signed)
Patient ID: [name], male   DOB: 08-May-1951, 63 y.o.   MRN: [id number] Discharge Note-Seen by Heloise Purpura NP this am and he determined he can be discharged today. SW Tamela Oddi tried much of day to find him a Airline pilot but without success. He was discharged with community resources for AA and a community program that wiill come to him to evaluated him for substance abuse services. He is hopefull re this referral. Had teared up earlier when he found out he didn't have a rehab program to go to.He called his sister to pick him up and she arrived. All of his property was returned to him and reviewed his discharge instructions.He is denying suicidal and homicidal ideations. Escorted to lobby to go home with his sister who he lives with.

## 2014-06-20 NOTE — Discharge Summary (Signed)
[facility] OBS UNIT DISCHARGE SUMMARY  Subjective: Pt seen and chart reviewed. He spent the night in the [facility] OBS Unit without incident. His BAL upon admission to the ED was: <11. His last reported alcohol was 48 hours ago on Saturday 06/18/2014. His most recent CIWA scores are: 10, 6, 7, but the only rating for the one this morning was 7 points for agitation and no other withdrawal symptoms whatsoever.   Pt denies SI, HI, and AVH, contracts for safety. His goal is to do inpatient rehab for alcohol and we will send referrals, but pt will be discharged today if we do not find a bed by approximately 4:00PM, which is the 23 hour mark.   *UPDATE: TTS was unable to secure placement within [place] and [place] after thorough searching. Pt will be discharged home with outpatient resources.    HPI:  [name] is an 63 y.o. male. Patient was brought into the ED by his sister because of alcohol and cocaine dependence.  Patient reports using alcohol and cocaine off/on for the patient 30 or more years.  Patient reports using crack daily, $20-120, last used Saturday $800. Patient reports drinking alcohol daily, 1-4 40oz beer and last used on Saturday an unknown amount but he reports a lot.  Patient currently denies SI/HI, hallucinations, and other self-injurious behaviors.  Patient reports a history of withdrawal such as panic attacks, decreased sleep, depression, nausea, and vomiting. Patient denies history of seizures or blackouts.    Axis I: Alcohol Abuse, Major Depression, Recurrent severe, Substance Abuse and Substance Induced Mood Disorder Axis II: Deferred Axis III: History reviewed. No pertinent past medical history. Axis IV: economic problems, housing problems, occupational problems, other psychosocial or environmental problems, problems related to legal system/crime, problems related to social environment, problems with access to health care services and problems with primary support group Axis V: 51-60 moderate  symptoms  Psychiatric Specialty Exam: Physical Exam  ROS  BP 104/83 mmHg  Pulse 70  Temp(Src) 97.8 F (36.6 C) (Oral)  Resp 16  Ht 5\' 7"  (1.702 m)  Wt 70.308 kg (155 lb)  BMI 24.27 kg/m2   General Appearance: Casual and Fairly Groomed  Engineer, water::  Good  Speech:  Clear and Coherent and Normal Rate  Volume:  Normal  Mood:  Anxious and Depressed  Affect:  Depressed  Thought Process:  Coherent and Goal Directed  Orientation:  Full (Time, Place, and Person)  Thought Content:  WDL  Suicidal Thoughts:  No  Homicidal Thoughts:  No  Memory:  Immediate;   Fair Recent;   Fair Remote;   Fair  Judgement:  Fair  Insight:  Fair  Psychomotor Activity:  Normal  Concentration:  Good  Recall:  Fair  Akathisia:  No  Handed:    AIMS (if indicated):     Assets:  Communication Skills Desire for Improvement Housing Resilience Social Support  Sleep:  Number of Hours: 7      Past Medical History: History reviewed. No pertinent past medical history.  History reviewed. No pertinent past surgical history.  Family History: History reviewed. No pertinent family history.  Social History:  reports that he has been smoking.  He does not have any smokeless tobacco history on file. He reports that he drinks alcohol. He reports that he uses illicit drugs.  Additional Social History:  Alcohol / Drug Use Pain Medications: none noted Prescriptions: none noted Over the Counter: noted noted History of alcohol / drug use?: Yes Negative Consequences of Use:  Financial Withdrawal Symptoms: Agitation, Irritability, Tremors, Weakness  CIWA: CIWA-Ar BP: (!) 76/50 mmHg (nurse notified of low blood pressure) Pulse Rate: (!) 108 Nausea and Vomiting: no nausea and no vomiting Tactile Disturbances: none Tremor: no tremor Auditory Disturbances: not present Paroxysmal Sweats: no sweat visible Visual Disturbances: not present Anxiety: no anxiety, at ease Headache, Fullness in Head: none  present Agitation: paces back and forth during most of the interview, or constantly thrashes about Orientation and Clouding of Sensorium: oriented and can do serial additions CIWA-Ar Total: 7 COWS:    Allergies:  Allergies  Allergen Reactions  . Codeine     REACTION: nausea    Home Medications:  Medications Prior to Admission  Medication Sig Dispense Refill  . QUEtiapine (SEROQUEL) 400 MG tablet TAKE 1 TABLET BY MOUTH TWICE DAILY (Patient taking differently: Take 400 mg by mouth at bedtime. TAKE 1 TABLET BY MOUTH TWICE DAILY) 60 tablet 12  . acetaminophen (TYLENOL) 500 MG tablet Take 500 mg by mouth every 4 (four) hours as needed. For pain      . albuterol (VENTOLIN HFA) 108 (90 BASE) MCG/ACT inhaler Inhale 2 puffs into the lungs every 4 (four) hours as needed. For shortness of breath or wheezing. 1 Inhaler 2  . divalproex (DEPAKOTE) 500 MG EC tablet Take 1,000 mg by mouth at bedtime.      . gabapentin (NEURONTIN) 300 MG capsule Take 300 mg by mouth 3 (three) times daily. Need to verify dose.  Given by [facility]       OB/GYN Status:  No LMP for male patient.  Disposition:  Seek inpatient alcohol rehab per patient request. However, BAL was below detectable limits so this will be a challenge. If no bed found by 4:00PM, pt will discharge to follow up at home if BP is stable.   Benjamine Mola, FNP-BC  06/20/2014 2:39 AM

## 2014-06-20 NOTE — Progress Notes (Signed)
Pt in OBS unit and is seeking placement for detox. SW placed to [facility], [facility] and [facility] who declined pt due to insurance. Pt will be discharged with resources. Pt given information on local AA meetings and was advised to call [facility] to schedule an assessment for services.  Charlene Brooke, MSW  Social Worker [phone]

## 2014-08-05 DIAGNOSIS — E568 Deficiency of other vitamins: Secondary | ICD-10-CM | POA: Diagnosis not present

## 2014-08-05 DIAGNOSIS — Z0000 Encounter for general adult medical examination without abnormal findings: Secondary | ICD-10-CM | POA: Diagnosis not present

## 2014-08-05 DIAGNOSIS — Z13220 Encounter for screening for lipoid disorders: Secondary | ICD-10-CM | POA: Diagnosis not present

## 2014-08-05 DIAGNOSIS — R5383 Other fatigue: Secondary | ICD-10-CM | POA: Diagnosis not present

## 2014-08-05 DIAGNOSIS — Z79899 Other long term (current) drug therapy: Secondary | ICD-10-CM | POA: Diagnosis not present

## 2014-08-15 DIAGNOSIS — R05 Cough: Secondary | ICD-10-CM | POA: Diagnosis not present

## 2014-08-15 DIAGNOSIS — R5383 Other fatigue: Secondary | ICD-10-CM | POA: Diagnosis not present

## 2014-08-15 DIAGNOSIS — R0602 Shortness of breath: Secondary | ICD-10-CM | POA: Diagnosis not present

## 2014-08-18 DIAGNOSIS — J209 Acute bronchitis, unspecified: Secondary | ICD-10-CM | POA: Diagnosis not present

## 2014-08-30 ENCOUNTER — Other Ambulatory Visit: Payer: Self-pay | Admitting: Family Medicine

## 2014-09-02 DIAGNOSIS — J209 Acute bronchitis, unspecified: Secondary | ICD-10-CM | POA: Diagnosis not present

## 2014-09-28 ENCOUNTER — Other Ambulatory Visit: Payer: Self-pay | Admitting: *Deleted

## 2014-09-29 MED ORDER — QUETIAPINE FUMARATE 400 MG PO TABS: 400.0000 mg | ORAL_TABLET | Freq: Two times a day (BID) | ORAL | Status: DC

## 2014-10-17 ENCOUNTER — Telehealth: Payer: Self-pay | Admitting: Family Medicine

## 2014-10-17 NOTE — Telephone Encounter (Signed)
Needs documentation from Dr Andria Frames as to how long he has been on seraquel Please mail to pt

## 2014-10-18 NOTE — Telephone Encounter (Signed)
LVM for patient to call back. ?

## 2014-10-18 NOTE — Telephone Encounter (Signed)
Dear [name] Team Please let him know Dr. Andria Frames will be out  of office until tomorrow and then route this mssg to Dr. Annett Fabian! Dorcas Mcmurray

## 2014-10-19 NOTE — Telephone Encounter (Signed)
Called and left message that he has been on this medication since at least 11/05/2010, possibly longer.

## 2014-10-27 ENCOUNTER — Telehealth: Payer: Self-pay | Admitting: Family Medicine

## 2014-10-27 NOTE — Telephone Encounter (Signed)
Would like to speak to PCP about the [facility] / thanks [employer], ASA

## 2014-10-27 NOTE — Telephone Encounter (Signed)
Called and discussed.  Answered his questions.  He has moved and is uneasy with his new physician.  He is too far away to continue to use me as primary.

## 2014-10-31 ENCOUNTER — Telehealth: Payer: Self-pay | Admitting: Family Medicine

## 2014-10-31 NOTE — Telephone Encounter (Signed)
Letter provided addressing my knowledge of the situation. Will mail to his home address

## 2014-10-31 NOTE — Telephone Encounter (Signed)
Spoke with patient and he stated that DMV pulled his license 1 month ago and that he received letter last week informing him that this had been done. He states that they told him he is unfit to drive due to medications he is on re: Seroquel. They told him that he is unsafe to drive and he is appeling this decision and needs letter stating that he is ok to drive.

## 2014-10-31 NOTE — Telephone Encounter (Signed)
DMV pulled pts license. Wants dr Andria Frames to write a letter. Appeal date June 10. Would like to talk to dr Andria Frames about this

## 2014-11-07 ENCOUNTER — Telehealth: Payer: Self-pay | Admitting: Family Medicine

## 2014-11-07 NOTE — Telephone Encounter (Signed)
Mailed list to patient. Fabrice Dyal,CMA

## 2014-11-07 NOTE — Telephone Encounter (Signed)
LM for patient informing him that I "mailed his request".  Jazmin Hartsell,CMA

## 2014-11-07 NOTE — Telephone Encounter (Signed)
DMV is not requesting pt see a psychiatrist  He would like a list of psychiatrist so he can get this done his appeal date of June 10.

## 2014-11-25 ENCOUNTER — Other Ambulatory Visit: Payer: Self-pay | Admitting: Family Medicine

## 2014-11-25 NOTE — Telephone Encounter (Signed)
Pt called and would like a refill on his Seroquel called in,. jw

## 2014-12-01 ENCOUNTER — Telehealth: Payer: Self-pay | Admitting: Family Medicine

## 2014-12-01 NOTE — Telephone Encounter (Signed)
Mr. [name] is calling to speak with Dr. Andria Frames about a letter that is needed. He states that Dr. Andria Frames would know what he is talking about, he just wants to make sure that they are "on the same page". A call at the earliest convenience would be sufficient. Thank you, [name], ASA

## 2014-12-01 NOTE — Telephone Encounter (Signed)
Will forward to MD. Earlyne Feeser,CMA  

## 2014-12-02 NOTE — Telephone Encounter (Signed)
Discussed.  He needs driving evaluation.  He will make an appointment to see me.

## 2014-12-16 ENCOUNTER — Ambulatory Visit: Payer: Self-pay | Admitting: Family Medicine

## 2014-12-22 DIAGNOSIS — M4302 Spondylolysis, cervical region: Secondary | ICD-10-CM | POA: Diagnosis not present

## 2014-12-28 ENCOUNTER — Ambulatory Visit (INDEPENDENT_AMBULATORY_CARE_PROVIDER_SITE_OTHER): Payer: Medicare Other | Admitting: Family Medicine

## 2014-12-28 ENCOUNTER — Encounter: Payer: Self-pay | Admitting: Family Medicine

## 2014-12-28 VITALS — BP 83/63 | HR 90 | Temp 97.6°F | Resp 20 | Wt 148.0 lb

## 2014-12-28 DIAGNOSIS — J438 Other emphysema: Secondary | ICD-10-CM | POA: Diagnosis not present

## 2014-12-28 DIAGNOSIS — Z720 Tobacco use: Secondary | ICD-10-CM | POA: Diagnosis not present

## 2014-12-28 DIAGNOSIS — F17200 Nicotine dependence, unspecified, uncomplicated: Secondary | ICD-10-CM

## 2014-12-28 DIAGNOSIS — Z85528 Personal history of other malignant neoplasm of kidney: Secondary | ICD-10-CM | POA: Diagnosis not present

## 2014-12-28 DIAGNOSIS — F319 Bipolar disorder, unspecified: Secondary | ICD-10-CM | POA: Diagnosis not present

## 2014-12-28 DIAGNOSIS — F1990 Other psychoactive substance use, unspecified, uncomplicated: Secondary | ICD-10-CM

## 2014-12-28 DIAGNOSIS — F1911 Other psychoactive substance abuse, in remission: Secondary | ICD-10-CM

## 2014-12-28 LAB — POCT URINALYSIS DIPSTICK
BILIRUBIN UA: NEGATIVE
Blood, UA: NEGATIVE
Glucose, UA: NEGATIVE
Ketones, UA: NEGATIVE
Leukocytes, UA: NEGATIVE
NITRITE UA: NEGATIVE
Protein, UA: NEGATIVE
Spec Grav, UA: 1.01
UROBILINOGEN UA: 0.2
pH, UA: 6

## 2014-12-28 NOTE — Progress Notes (Signed)
   Subjective:    Patient ID: [name], male    DOB: 24-Nov-1950, 64 y.o.   MRN: [id number]  HPI Lives in [place] with his sister.  States things are going well and that he is clean and sober.   He is here because of drivers life and to re establish care.  He recently had a "complete physical" with blood testing at his previous PCP.  He is behind on health maint from my records but may have caught up with some or all of these while in [place].  Will request records.  Driving:  License revoked for unclear reasons to me.  I believe because his bipolar.  He brings in a letter that I must supply a letter to the [facility].  Please see correspondence.  I will also comment in that letter that the patient has a history of drug abuse and shows no signs of intoxication or continued use.   At present, he is only taking Seroquel.  (When last with me, he was also taking depakote.)    Stress remains an issue.  His sister wants to give up the house they are renting and get a small apartment on her own.  He would have a hard time affording housing on his own with his modest disability check.    Review of Systems     Objective:   Physical Exam  Calm and relaxed. Good weight Lungs clear Cardiac RRR without m or g Psych oriented, cooperative and under control.        Assessment & Plan:

## 2014-12-28 NOTE — Patient Instructions (Signed)
See me in three months.  I will send the letter to the [facility].

## 2014-12-28 NOTE — Assessment & Plan Note (Signed)
Not checked recently

## 2014-12-29 NOTE — Assessment & Plan Note (Signed)
Seems to be under good control with just seroquel.

## 2014-12-29 NOTE — Assessment & Plan Note (Signed)
I suspect he needs a controler medication.  Await records and likely add next visit.  Finances may be an issue.

## 2014-12-29 NOTE — Assessment & Plan Note (Signed)
Advised to quit.  Counseled about options.

## 2014-12-29 NOTE — Assessment & Plan Note (Signed)
I did [place] data base search.  No controled substances prescribed in the past 6 months.

## 2014-12-30 ENCOUNTER — Telehealth: Payer: Self-pay | Admitting: Family Medicine

## 2014-12-30 NOTE — Telephone Encounter (Signed)
Patient is calling for his test results from two days ago

## 2015-01-02 NOTE — Telephone Encounter (Signed)
Called.  Informed urine was clear and that the letter had been sent to the [facility].

## 2015-01-04 ENCOUNTER — Telehealth: Payer: Self-pay | Admitting: Family Medicine

## 2015-01-04 NOTE — Telephone Encounter (Signed)
Wanted to talke to him about "some issues" that I got Please call pt

## 2015-01-04 NOTE — Telephone Encounter (Signed)
Pt will be in and out all day so call him on this number [phone]. jw

## 2015-01-04 NOTE — Telephone Encounter (Signed)
LMOVM for callback asking him for additional information. Harrietta Incorvaia, Salome Spotted

## 2015-01-05 NOTE — Telephone Encounter (Signed)
Called and listened to his concerns.  No action on his part.

## 2015-01-23 ENCOUNTER — Encounter: Payer: Self-pay | Admitting: Family Medicine

## 2015-01-23 NOTE — Progress Notes (Signed)
Patient ID: [name], male   DOB: 08/31/1950, 63 y.o.   MRN: [id number] Received outside records from [facility].  Most recent labs from 10/04/14 CBC mild polycythemia CMP normal PSA normal. Will scan to chart

## 2015-01-27 ENCOUNTER — Encounter: Payer: Self-pay | Admitting: Family Medicine

## 2015-01-27 ENCOUNTER — Ambulatory Visit (INDEPENDENT_AMBULATORY_CARE_PROVIDER_SITE_OTHER): Payer: Medicare Other | Admitting: Family Medicine

## 2015-01-27 ENCOUNTER — Ambulatory Visit
Admission: RE | Admit: 2015-01-27 | Discharge: 2015-01-27 | Disposition: A | Discharge status: Home / Self Care | Payer: Medicare Other | Source: Ambulatory Visit | Attending: Family Medicine | Admitting: Family Medicine

## 2015-01-27 VITALS — BP 92/64 | HR 95 | Temp 97.6°F | Ht 67.0 in | Wt 144.8 lb

## 2015-01-27 DIAGNOSIS — J439 Emphysema, unspecified: Secondary | ICD-10-CM | POA: Diagnosis not present

## 2015-01-27 DIAGNOSIS — Z720 Tobacco use: Secondary | ICD-10-CM

## 2015-01-27 DIAGNOSIS — Z85528 Personal history of other malignant neoplasm of kidney: Secondary | ICD-10-CM

## 2015-01-27 DIAGNOSIS — Z114 Encounter for screening for human immunodeficiency virus [HIV]: Secondary | ICD-10-CM

## 2015-01-27 DIAGNOSIS — R634 Abnormal weight loss: Secondary | ICD-10-CM

## 2015-01-27 DIAGNOSIS — R0602 Shortness of breath: Secondary | ICD-10-CM | POA: Diagnosis not present

## 2015-01-27 DIAGNOSIS — J449 Chronic obstructive pulmonary disease, unspecified: Secondary | ICD-10-CM | POA: Diagnosis not present

## 2015-01-27 DIAGNOSIS — F319 Bipolar disorder, unspecified: Secondary | ICD-10-CM

## 2015-01-27 DIAGNOSIS — F17200 Nicotine dependence, unspecified, uncomplicated: Secondary | ICD-10-CM

## 2015-01-27 LAB — CBC
HCT: 40.6 % (ref 39.0–52.0)
Hemoglobin: 13.6 g/dL (ref 13.0–17.0)
MCH: 28.9 pg (ref 26.0–34.0)
MCHC: 33.5 g/dL (ref 30.0–36.0)
MCV: 86.4 fL (ref 78.0–100.0)
MPV: 9.3 fL (ref 8.6–12.4)
Platelets: 442 10*3/uL — ABNORMAL HIGH (ref 150–400)
RBC: 4.7 MIL/uL (ref 4.22–5.81)
RDW: 13.6 % (ref 11.5–15.5)
WBC: 4.2 10*3/uL (ref 4.0–10.5)

## 2015-01-27 LAB — COMPREHENSIVE METABOLIC PANEL
ALBUMIN: 3.3 g/dL — AB (ref 3.5–5.2)
ALK PHOS: 109 U/L (ref 39–117)
ALT: <8 U/L (ref 0–53)
AST: 8 U/L (ref 0–37)
BUN: 6 mg/dL (ref 6–23)
CO2: 28 mEq/L (ref 19–32)
CREATININE: 0.87 mg/dL (ref 0.50–1.35)
Calcium: 9.8 mg/dL (ref 8.4–10.5)
Chloride: 100 mEq/L (ref 96–112)
Glucose, Bld: 95 mg/dL (ref 70–99)
POTASSIUM: 4.5 meq/L (ref 3.5–5.3)
SODIUM: 139 meq/L (ref 135–145)
Total Bilirubin: 0.5 mg/dL (ref 0.2–1.2)
Total Protein: 6.6 g/dL (ref 6.0–8.3)

## 2015-01-27 LAB — POCT URINALYSIS DIPSTICK
BILIRUBIN UA: NEGATIVE
Blood, UA: NEGATIVE
Glucose, UA: NEGATIVE
KETONES UA: NEGATIVE
Nitrite, UA: NEGATIVE
Protein, UA: NEGATIVE
Spec Grav, UA: 1.01
Urobilinogen, UA: 0.2
pH, UA: 7

## 2015-01-27 LAB — POCT UA - MICROSCOPIC ONLY

## 2015-01-27 LAB — POCT SEDIMENTATION RATE: POCT SED RATE: 64 mm/h — AB (ref 0–22)

## 2015-01-27 MED ORDER — FLUTICASONE PROPIONATE HFA 220 MCG/ACT IN AERO: 2.0000 | INHALATION_SPRAY | Freq: Two times a day (BID) | RESPIRATORY_TRACT | Status: DC

## 2015-01-27 NOTE — Progress Notes (Signed)
   Subjective:    Patient ID: [name], male    DOB: [birth year], 64 y.o.   MRN: [id number]  HPI I was present and personally did the H&PE as documented by Dupisanie MS# Very concerning story of unintentional weight loss.  At high risk for CA due to smoking and strongly positive FHx. Localizing symptoms are 1. Resp - known COPD but recent dramatic increase in DOE 2. CNS - no increase in his mild chronic headaches but states his memory is not as good recently. 3. GI - At risk for stomach CA due to smoking.  He is behind in his colon cancer screening.   4. Renal.  Old kidney cancer "about 20 years ago."  No symptoms.    Review of Systems     Objective:   Physical Exam HEENT normal Neck no nodes. Lungs limited VC.  Increase AP diameter of chest. Minimal wheeze. Cardiac RRR without m or g Abd no masses.  Liver is 1cm below costal margin. Ext no edema.       Assessment & Plan:

## 2015-01-27 NOTE — Assessment & Plan Note (Signed)
Will do first order testing.  Likely will need CT scan - Maybe of chest, abd, pelvis and brain.

## 2015-01-27 NOTE — Assessment & Plan Note (Signed)
CXR concerning for new lung cancer.  Will get CT of chest with and without.  Wait to order to see if also need head or abd CT,.

## 2015-01-27 NOTE — Progress Notes (Signed)
Subjective:     Patient ID: [name], male   DOB: 05/24/1951, 64 y.o.   MRN: [id number]  HPI  WEIGHT LOSS  Has lost ~30 lbs in the past 2 months and he is worried about it because this is what happened last time he had cancer, brain cancer runs rampant in his family. His eating habits are the same but he is still losing weight. He says that he is constipated but denies any blood in his stool. He denies any lower extremity edema. He denies any sores in his mouth or that his dentures are fitting any different.   SHORTNESS OF BREATH  He is worried about his inability to breathe, he can hardly walk anymore without getting short of breath. He says that he was given a lung test but could not complete it because he could not get enough breath.   MEMORY DIFFICULTY  He says that in church when the preacher is preaching he cannot remember the 1st thing he said. He has always had headaches but this is not changed.   Review of Systems Pertinent ROS in HPI    Objective:   Physical Exam  Constitutional: He is oriented to person, place, and time. No distress.  HENT:  Head: Normocephalic and atraumatic.  Eyes: Conjunctivae are normal. Pupils are equal, round, and reactive to light. Right eye exhibits no discharge. Left eye exhibits no discharge. No scleral icterus.  Neck: Neck supple. No JVD present. No tracheal deviation present. No thyromegaly present.  Cardiovascular: Normal rate, regular rhythm and normal heart sounds.  Exam reveals no gallop and no friction rub.   No murmur heard. Pulmonary/Chest: Effort normal. No stridor. No respiratory distress. He has wheezes. He has no rales. He exhibits no tenderness.  Abdominal: Soft. Bowel sounds are normal. He exhibits no distension and no mass. There is no tenderness. There is no rebound and no guarding.  No HSM  Lymphadenopathy:    He has no cervical adenopathy.  Neurological: He is alert and oriented to person, place, and time.  Skin: He  is not diaphoretic.       Assessment:     Martin  Gray is a 64 y.o male a PMHx significant for renal malignancy, tobacco abuse and significant unintentional ~30lb weight loss and shortness of breath.     Plan:     SHORTNESS OF BREATH: Most likely due to COPD and chronic tobacco abuse.  - PFT referral - Prescribed Flovent 220mg  2 puffs BID - O2 saturation   WEIGHT LOSS: undetermeined eitology, as this patient has had malignancy before and is a chronic smoker it is appropriate to look for any signs of malignancy on lab work. Thyroid function tests are also appropriate to rule out hyperthyroidism as a cause of the malignancy - TSH - Urine  - Chest Xray - [facility] - CMP  MEMORY LOSS: Not being explored at this time considering the patients other chief complaints. No sings of dementia may be age related.  - Told patient to keep an eye on it and if anything changes or gets worse we will evaluate.

## 2015-01-27 NOTE — Assessment & Plan Note (Signed)
Controled.  Sad and worried about health.

## 2015-01-27 NOTE — Patient Instructions (Signed)
Thank you for coming in today - We are doing some blood work and imaging to ge to the bottom of this weight loss - We gave you an inhaler FLOVENT to help open up your lungs, use it two times a day with 2two puffs into the lungs.  Smoking Cessation Quitting smoking is important to your health and has many advantages. However, it is not always easy to quit since nicotine is a very addictive drug. Oftentimes, people try 3 times or more before being able to quit. This document explains the best ways for you to prepare to quit smoking. Quitting takes hard work and a lot of effort, but you can do it. ADVANTAGES OF QUITTING SMOKING  You will live longer, feel better, and live better.  Your body will feel the impact of quitting smoking almost immediately.  Within 20 minutes, blood pressure decreases. Your pulse returns to its normal level.  After 8 hours, carbon monoxide levels in the blood return to normal. Your oxygen level increases.  After 24 hours, the chance of having a heart attack starts to decrease. Your breath, hair, and body stop smelling like smoke.  After 48 hours, damaged nerve endings begin to recover. Your sense of taste and smell improve.  After 72 hours, the body is virtually free of nicotine. Your bronchial tubes relax and breathing becomes easier.  After 2 to 12 weeks, lungs can hold more air. Exercise becomes easier and circulation improves.  The risk of having a heart attack, stroke, cancer, or lung disease is greatly reduced.  After 1 year, the risk of coronary heart disease is cut in half.  After 5 years, the risk of stroke falls to the same as a nonsmoker.  After 10 years, the risk of lung cancer is cut in half and the risk of other cancers decreases significantly.  After 15 years, the risk of coronary heart disease drops, usually to the level of a nonsmoker.  If you are pregnant, quitting smoking will improve your chances of having a healthy baby.  The people you  live with, especially any children, will be healthier.  You will have extra money to spend on things other than cigarettes. QUESTIONS TO THINK ABOUT BEFORE ATTEMPTING TO QUIT You may want to talk about your answers with your health care provider.  Why do you want to quit?  If you tried to quit in the past, what helped and what did not?  What will be the most difficult situations for you after you quit? How will you plan to handle them?  Who can help you through the tough times? Your family? Friends? A health care provider?  What pleasures do you get from smoking? What ways can you still get pleasure if you quit? Here are some questions to ask your health care provider:  How can you help me to be successful at quitting?  What medicine do you think would be best for me and how should I take it?  What should I do if I need more help?  What is smoking withdrawal like? How can I get information on withdrawal? GET READY  Set a quit date.  Change your environment by getting rid of all cigarettes, ashtrays, matches, and lighters in your home, car, or work. Do not let people smoke in your home.  Review your past attempts to quit. Think about what worked and what did not. GET SUPPORT AND ENCOURAGEMENT You have a better chance of being successful if you have help.  You can get support in many ways.  Tell your family, friends, and coworkers that you are going to quit and need their support. Ask them not to smoke around you.  Get individual, group, or telephone counseling and support. Programs are available at [employer] and health centers. Call your local health department for information about programs in your area.  Spiritual beliefs and practices may help some smokers quit.  Download a "quit meter" on your computer to keep track of quit statistics, such as how long you have gone without smoking, cigarettes not smoked, and money saved.  Get a self-help book about quitting smoking  and staying off tobacco. Gueydan yourself from urges to smoke. Talk to someone, go for a walk, or occupy your time with a task.  Change your normal routine. Take a different route to work. Drink tea instead of coffee. Eat breakfast in a different place.  Reduce your stress. Take a hot bath, exercise, or read a book.  Plan something enjoyable to do every day. Reward yourself for not smoking.  Explore interactive web-based programs that specialize in helping you quit. GET MEDICINE AND USE IT CORRECTLY Medicines can help you stop smoking and decrease the urge to smoke. Combining medicine with the above behavioral methods and support can greatly increase your chances of successfully quitting smoking.  Nicotine replacement therapy helps deliver nicotine to your body without the negative effects and risks of smoking. Nicotine replacement therapy includes nicotine gum, lozenges, inhalers, nasal sprays, and skin patches. Some may be available over-the-counter and others require a prescription.  Antidepressant medicine helps people abstain from smoking, but how this works is unknown. This medicine is available by prescription.  Nicotinic receptor partial agonist medicine simulates the effect of nicotine in your brain. This medicine is available by prescription. Ask your health care provider for advice about which medicines to use and how to use them based on your health history. Your health care provider will tell you what side effects to look out for if you choose to be on a medicine or therapy. Carefully read the information on the package. Do not use any other product containing nicotine while using a nicotine replacement product.  RELAPSE OR DIFFICULT SITUATIONS Most relapses occur within the first 3 months after quitting. Do not be discouraged if you start smoking again. Remember, most people try several times before finally quitting. You may have symptoms of  withdrawal because your body is used to nicotine. You may crave cigarettes, be irritable, feel very hungry, cough often, get headaches, or have difficulty concentrating. The withdrawal symptoms are only temporary. They are strongest when you first quit, but they will go away within 10-14 days. To reduce the chances of relapse, try to:  Avoid drinking alcohol. Drinking lowers your chances of successfully quitting.  Reduce the amount of caffeine you consume. Once you quit smoking, the amount of caffeine in your body increases and can give you symptoms, such as a rapid heartbeat, sweating, and anxiety.  Avoid smokers because they can make you want to smoke.  Do not let weight gain distract you. Many smokers will gain weight when they quit, usually less than 10 pounds. Eat a healthy diet and stay active. You can always lose the weight gained after you quit.  Find ways to improve your mood other than smoking. FOR MORE INFORMATION  www.smokefree.gov  Document Released: 06/18/2001 Document Revised: 11/08/2013 Document Reviewed: 10/03/2011 [facility] Patient Information 2015 [place], [place]. This  information is not intended to replace advice given to you by your health care provider. Make sure you discuss any questions you have with your health care provider.  

## 2015-01-27 NOTE — Assessment & Plan Note (Addendum)
Certainly at risk for lung cancer.  Strongly encouraged to quit.

## 2015-01-27 NOTE — Assessment & Plan Note (Signed)
Start on inhaled steroid as controler.  Worried about pulmonary neoplasm

## 2015-01-27 NOTE — Assessment & Plan Note (Addendum)
At risk for recurrence even though OK for 20 years.  Do not know cancer type.  More likely if original was a transitional cell.

## 2015-01-28 LAB — HIV ANTIBODY (ROUTINE TESTING W REFLEX): HIV 1&2 Ab, 4th Generation: NONREACTIVE

## 2015-01-28 LAB — TSH: TSH: 1.901 u[IU]/mL (ref 0.350–4.500)

## 2015-01-30 ENCOUNTER — Telehealth: Payer: Self-pay | Admitting: Family Medicine

## 2015-01-30 DIAGNOSIS — D381 Neoplasm of uncertain behavior of trachea, bronchus and lung: Secondary | ICD-10-CM | POA: Insufficient documentation

## 2015-01-30 DIAGNOSIS — R413 Other amnesia: Secondary | ICD-10-CM | POA: Insufficient documentation

## 2015-01-30 NOTE — Telephone Encounter (Signed)
Already informed.  See separate phone note.

## 2015-01-30 NOTE — Assessment & Plan Note (Signed)
Recent memory loss in setting of likely lung cancer worrisome for metastatic disease.

## 2015-01-30 NOTE — Telephone Encounter (Signed)
Pt called and would like to know what his test results are. jw

## 2015-01-30 NOTE — Telephone Encounter (Signed)
Called and discussed that blood work was OK but CXR showed new changes - worry for cancer.  Need CT scan.  Will also get CT of the brain given his recent memory changes.  Orders entered.

## 2015-01-30 NOTE — Telephone Encounter (Signed)
[name] is calling because he needs someone to inform him of his recent lab results. He also needs a medication refill. He does not know the name of the medication but he states that he needs to take it before having an MRI. Thank you, [name], ASA

## 2015-02-02 NOTE — Telephone Encounter (Signed)
PT informed of appointment for CT on 02/03/2015. Katharina Caper, Bryann Gentz D, [place]

## 2015-02-03 ENCOUNTER — Ambulatory Visit (HOSPITAL_COMMUNITY)
Admission: RE | Admit: 2015-02-03 | Discharge: 2015-02-03 | Disposition: A | Discharge status: Home / Self Care | Payer: Medicare Other | Source: Ambulatory Visit | Attending: Family Medicine | Admitting: Family Medicine

## 2015-02-03 ENCOUNTER — Inpatient Hospital Stay (HOSPITAL_COMMUNITY)
Admission: AD | Admit: 2015-02-03 | Discharge: 2015-02-05 | DRG: 180 | Disposition: A | Discharge status: Hospice | Payer: Medicare Other | Source: Ambulatory Visit | Attending: Family Medicine | Admitting: Family Medicine

## 2015-02-03 ENCOUNTER — Other Ambulatory Visit: Payer: Self-pay | Admitting: Family Medicine

## 2015-02-03 ENCOUNTER — Encounter (HOSPITAL_COMMUNITY): Payer: Self-pay | Admitting: *Deleted

## 2015-02-03 DIAGNOSIS — Z6822 Body mass index (BMI) 22.0-22.9, adult: Secondary | ICD-10-CM

## 2015-02-03 DIAGNOSIS — C801 Malignant (primary) neoplasm, unspecified: Secondary | ICD-10-CM | POA: Diagnosis present

## 2015-02-03 DIAGNOSIS — R413 Other amnesia: Secondary | ICD-10-CM

## 2015-02-03 DIAGNOSIS — Z808 Family history of malignant neoplasm of other organs or systems: Secondary | ICD-10-CM | POA: Diagnosis not present

## 2015-02-03 DIAGNOSIS — Z79899 Other long term (current) drug therapy: Secondary | ICD-10-CM | POA: Diagnosis not present

## 2015-02-03 DIAGNOSIS — J449 Chronic obstructive pulmonary disease, unspecified: Secondary | ICD-10-CM | POA: Diagnosis present

## 2015-02-03 DIAGNOSIS — R0600 Dyspnea, unspecified: Secondary | ICD-10-CM | POA: Diagnosis present

## 2015-02-03 DIAGNOSIS — R252 Cramp and spasm: Secondary | ICD-10-CM | POA: Diagnosis present

## 2015-02-03 DIAGNOSIS — Z801 Family history of malignant neoplasm of trachea, bronchus and lung: Secondary | ICD-10-CM

## 2015-02-03 DIAGNOSIS — Z905 Acquired absence of kidney: Secondary | ICD-10-CM | POA: Diagnosis present

## 2015-02-03 DIAGNOSIS — Z85528 Personal history of other malignant neoplasm of kidney: Secondary | ICD-10-CM | POA: Diagnosis not present

## 2015-02-03 DIAGNOSIS — C7800 Secondary malignant neoplasm of unspecified lung: Principal | ICD-10-CM | POA: Diagnosis present

## 2015-02-03 DIAGNOSIS — C7802 Secondary malignant neoplasm of left lung: Secondary | ICD-10-CM | POA: Diagnosis not present

## 2015-02-03 DIAGNOSIS — I2510 Atherosclerotic heart disease of native coronary artery without angina pectoris: Secondary | ICD-10-CM | POA: Diagnosis not present

## 2015-02-03 DIAGNOSIS — R519 Headache, unspecified: Secondary | ICD-10-CM | POA: Insufficient documentation

## 2015-02-03 DIAGNOSIS — C7931 Secondary malignant neoplasm of brain: Secondary | ICD-10-CM | POA: Diagnosis present

## 2015-02-03 DIAGNOSIS — Z515 Encounter for palliative care: Secondary | ICD-10-CM | POA: Diagnosis not present

## 2015-02-03 DIAGNOSIS — G936 Cerebral edema: Secondary | ICD-10-CM | POA: Diagnosis not present

## 2015-02-03 DIAGNOSIS — C7801 Secondary malignant neoplasm of right lung: Secondary | ICD-10-CM | POA: Diagnosis not present

## 2015-02-03 DIAGNOSIS — Z885 Allergy status to narcotic agent status: Secondary | ICD-10-CM

## 2015-02-03 DIAGNOSIS — K868 Other specified diseases of pancreas: Secondary | ICD-10-CM | POA: Diagnosis not present

## 2015-02-03 DIAGNOSIS — C799 Secondary malignant neoplasm of unspecified site: Secondary | ICD-10-CM

## 2015-02-03 DIAGNOSIS — I313 Pericardial effusion (noninflammatory): Secondary | ICD-10-CM | POA: Diagnosis not present

## 2015-02-03 DIAGNOSIS — G939 Disorder of brain, unspecified: Secondary | ICD-10-CM | POA: Diagnosis not present

## 2015-02-03 DIAGNOSIS — M5090 Cervical disc disorder, unspecified, unspecified cervical region: Secondary | ICD-10-CM | POA: Diagnosis not present

## 2015-02-03 DIAGNOSIS — R51 Headache: Secondary | ICD-10-CM | POA: Diagnosis not present

## 2015-02-03 DIAGNOSIS — F17210 Nicotine dependence, cigarettes, uncomplicated: Secondary | ICD-10-CM | POA: Diagnosis present

## 2015-02-03 DIAGNOSIS — F17200 Nicotine dependence, unspecified, uncomplicated: Secondary | ICD-10-CM | POA: Diagnosis not present

## 2015-02-03 DIAGNOSIS — R634 Abnormal weight loss: Secondary | ICD-10-CM | POA: Diagnosis present

## 2015-02-03 DIAGNOSIS — F3132 Bipolar disorder, current episode depressed, moderate: Secondary | ICD-10-CM | POA: Insufficient documentation

## 2015-02-03 DIAGNOSIS — D381 Neoplasm of uncertain behavior of trachea, bronchus and lung: Secondary | ICD-10-CM

## 2015-02-03 DIAGNOSIS — J910 Malignant pleural effusion: Secondary | ICD-10-CM | POA: Insufficient documentation

## 2015-02-03 DIAGNOSIS — F319 Bipolar disorder, unspecified: Secondary | ICD-10-CM | POA: Diagnosis not present

## 2015-02-03 DIAGNOSIS — R0602 Shortness of breath: Secondary | ICD-10-CM | POA: Diagnosis present

## 2015-02-03 HISTORY — DX: Malignant (primary) neoplasm, unspecified: C80.1

## 2015-02-03 LAB — CBC
HCT: 37 % — ABNORMAL LOW (ref 39.0–52.0)
HEMOGLOBIN: 12.3 g/dL — AB (ref 13.0–17.0)
MCH: 28.7 pg (ref 26.0–34.0)
MCHC: 33.2 g/dL (ref 30.0–36.0)
MCV: 86.2 fL (ref 78.0–100.0)
Platelets: 404 10*3/uL — ABNORMAL HIGH (ref 150–400)
RBC: 4.29 MIL/uL (ref 4.22–5.81)
RDW: 13.2 % (ref 11.5–15.5)
WBC: 4.8 10*3/uL (ref 4.0–10.5)

## 2015-02-03 LAB — CREATININE, SERUM
Creatinine, Ser: 0.94 mg/dL (ref 0.61–1.24)
GFR calc Af Amer: >60 mL/min (ref >60)
GFR calc non Af Amer: >60 mL/min (ref >60)

## 2015-02-03 MED ORDER — SODIUM CHLORIDE 0.9 % IJ SOLN
3.0000 mL | INTRAMUSCULAR | Status: DC | PRN
  Administered 2015-02-03: 3 mL via INTRAVENOUS
  Filled 2015-02-03: qty 3

## 2015-02-03 MED ORDER — SODIUM CHLORIDE 0.9 % IV SOLN: 250.0000 mL | INTRAVENOUS | Status: DC | PRN

## 2015-02-03 MED ORDER — ALBUTEROL SULFATE (2.5 MG/3ML) 0.083% IN NEBU
3.0000 mL | INHALATION_SOLUTION | Freq: Four times a day (QID) | RESPIRATORY_TRACT | Status: DC | PRN
Start: 2015-02-03 — End: 2015-02-05

## 2015-02-03 MED ORDER — ACETAMINOPHEN 325 MG PO TABS: 650.0000 mg | ORAL_TABLET | Freq: Four times a day (QID) | ORAL | Status: DC | PRN

## 2015-02-03 MED ORDER — DEXAMETHASONE SODIUM PHOSPHATE 4 MG/ML IJ SOLN
4.0000 mg | Freq: Once | INTRAMUSCULAR | Status: AC
  Administered 2015-02-03: 4 mg via INTRAVENOUS
  Filled 2015-02-03: qty 1

## 2015-02-03 MED ORDER — SODIUM CHLORIDE 0.9 % IJ SOLN: 3.0000 mL | Freq: Two times a day (BID) | INTRAMUSCULAR | Status: DC

## 2015-02-03 MED ORDER — QUETIAPINE FUMARATE 50 MG PO TABS
400.0000 mg | ORAL_TABLET | Freq: Two times a day (BID) | ORAL | Status: DC
  Administered 2015-02-03: 400 mg via ORAL
  Filled 2015-02-03 (×4): qty 8

## 2015-02-03 MED ORDER — ACETAMINOPHEN 650 MG RE SUPP: 650.0000 mg | Freq: Four times a day (QID) | RECTAL | Status: DC | PRN

## 2015-02-03 MED ORDER — HEPARIN SODIUM (PORCINE) 5000 UNIT/ML IJ SOLN
5000.0000 [IU] | Freq: Three times a day (TID) | INTRAMUSCULAR | Status: DC
  Filled 2015-02-03 (×2): qty 1

## 2015-02-03 MED ORDER — SODIUM CHLORIDE 0.9 % IV SOLN
INTRAVENOUS | Status: DC
  Administered 2015-02-03: 23:00:00 via INTRAVENOUS

## 2015-02-03 MED ORDER — HYDROCODONE-ACETAMINOPHEN 5-325 MG PO TABS: 1.0000 | ORAL_TABLET | Freq: Once | ORAL | Status: DC

## 2015-02-03 MED ORDER — HYDROCODONE-ACETAMINOPHEN 5-325 MG PO TABS
1.0000 | ORAL_TABLET | Freq: Once | ORAL | Status: AC
  Administered 2015-02-03: 1 via ORAL
  Filled 2015-02-03: qty 1

## 2015-02-03 MED ORDER — BUDESONIDE 0.5 MG/2ML IN SUSP
1.0000 mg | Freq: Two times a day (BID) | RESPIRATORY_TRACT | Status: DC
  Administered 2015-02-04: 1 mg via RESPIRATORY_TRACT
  Filled 2015-02-03 (×3): qty 4

## 2015-02-03 MED ORDER — ENSURE ENLIVE PO LIQD: 237.0000 mL | Freq: Two times a day (BID) | ORAL | Status: DC

## 2015-02-03 NOTE — Progress Notes (Signed)
Patient requested that his debit card be put in the safe. Asked patient for amount on the card, RN to call for another RN to witness the said amount. Patient got angry and asked to return his debit card to him. Debit card given back to patient.

## 2015-02-03 NOTE — Progress Notes (Signed)
Report called to Dr. Andria Frames by radiologist. Dr. Andria Frames spoke to patient over the phone. Patient was taken to admitting with iv in place.

## 2015-02-03 NOTE — Progress Notes (Signed)
Chaplain responded to page for Pt who had received bad new. Chaplain visited Pt to offer support. He said his pastor would come by. Chaplain prayed with Pt and called pastor and left message to call hospital.   02/03/15 2300  Clinical Encounter Type  Visited With Family  Visit Type Spiritual support  Referral From Nurse  Spiritual Encounters  Spiritual Needs Prayer;Emotional  Stress Factors  Patient Stress Factors Family relationships
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
## 2015-02-04 ENCOUNTER — Other Ambulatory Visit: Payer: Self-pay

## 2015-02-04 DIAGNOSIS — F3132 Bipolar disorder, current episode depressed, moderate: Secondary | ICD-10-CM | POA: Insufficient documentation

## 2015-02-04 DIAGNOSIS — J910 Malignant pleural effusion: Secondary | ICD-10-CM | POA: Insufficient documentation

## 2015-02-04 DIAGNOSIS — G939 Disorder of brain, unspecified: Secondary | ICD-10-CM | POA: Insufficient documentation

## 2015-02-04 DIAGNOSIS — R51 Headache: Secondary | ICD-10-CM

## 2015-02-04 DIAGNOSIS — C801 Malignant (primary) neoplasm, unspecified: Secondary | ICD-10-CM | POA: Insufficient documentation

## 2015-02-04 DIAGNOSIS — C7931 Secondary malignant neoplasm of brain: Secondary | ICD-10-CM | POA: Insufficient documentation

## 2015-02-04 LAB — COMPREHENSIVE METABOLIC PANEL
ALBUMIN: 2.7 g/dL — AB (ref 3.5–5.0)
ALT: 12 U/L — AB (ref 17–63)
AST: 10 U/L — AB (ref 15–41)
Alkaline Phosphatase: 112 U/L (ref 38–126)
Anion gap: 9 (ref 5–15)
BUN: 7 mg/dL (ref 6–20)
CALCIUM: 9.6 mg/dL (ref 8.9–10.3)
CO2: 25 mmol/L (ref 22–32)
Chloride: 100 mmol/L — ABNORMAL LOW (ref 101–111)
Creatinine, Ser: 0.85 mg/dL (ref 0.61–1.24)
GFR calc Af Amer: >60 mL/min (ref >60)
Glucose, Bld: 110 mg/dL — ABNORMAL HIGH (ref 65–99)
Potassium: 3.9 mmol/L (ref 3.5–5.1)
Sodium: 134 mmol/L — ABNORMAL LOW (ref 135–145)
Total Bilirubin: 0.6 mg/dL (ref 0.3–1.2)
Total Protein: 6.8 g/dL (ref 6.5–8.1)

## 2015-02-04 LAB — RAPID URINE DRUG SCREEN, HOSP PERFORMED
AMPHETAMINES: NOT DETECTED
Barbiturates: NOT DETECTED
Benzodiazepines: NOT DETECTED
Cocaine: NOT DETECTED
Opiates: POSITIVE — AB
TETRAHYDROCANNABINOL: NOT DETECTED

## 2015-02-04 LAB — PROTIME-INR
INR: 1.1 (ref 0.00–1.49)
Prothrombin Time: 14.4 seconds (ref 11.6–15.2)

## 2015-02-04 LAB — CBC
HCT: 40.5 % (ref 39.0–52.0)
Hemoglobin: 13.5 g/dL (ref 13.0–17.0)
MCH: 29 pg (ref 26.0–34.0)
MCHC: 33.3 g/dL (ref 30.0–36.0)
MCV: 86.9 fL (ref 78.0–100.0)
Platelets: 427 10*3/uL — ABNORMAL HIGH (ref 150–400)
RBC: 4.66 MIL/uL (ref 4.22–5.81)
RDW: 13.3 % (ref 11.5–15.5)
WBC: 7.4 10*3/uL (ref 4.0–10.5)

## 2015-02-04 LAB — LIPID PANEL
CHOLESTEROL: 142 mg/dL (ref 0–200)
HDL: 27 mg/dL — ABNORMAL LOW (ref >40)
LDL Cholesterol: 104 mg/dL — ABNORMAL HIGH (ref 0–99)
TRIGLYCERIDES: 54 mg/dL (ref <150)
Total CHOL/HDL Ratio: 5.3 RATIO
VLDL: 11 mg/dL (ref 0–40)

## 2015-02-04 MED ORDER — QUETIAPINE FUMARATE 200 MG PO TABS
400.0000 mg | ORAL_TABLET | Freq: Two times a day (BID) | ORAL | Status: DC
  Filled 2015-02-04 (×2): qty 2

## 2015-02-04 MED ORDER — DEXAMETHASONE SODIUM PHOSPHATE 4 MG/ML IJ SOLN
4.0000 mg | Freq: Four times a day (QID) | INTRAMUSCULAR | Status: DC
  Administered 2015-02-04 – 2015-02-05 (×3): 4 mg via INTRAVENOUS
  Filled 2015-02-04 (×8): qty 1

## 2015-02-04 MED ORDER — QUETIAPINE FUMARATE 50 MG PO TABS: 400.0000 mg | ORAL_TABLET | Freq: Every day | ORAL | Status: DC

## 2015-02-04 MED ORDER — QUETIAPINE FUMARATE 50 MG PO TABS
400.0000 mg | ORAL_TABLET | Freq: Every day | ORAL | Status: DC
  Administered 2015-02-04: 400 mg via ORAL

## 2015-02-04 MED ORDER — ENSURE ENLIVE PO LIQD
237.0000 mL | Freq: Every day | ORAL | Status: DC
  Administered 2015-02-04: 237 mL via ORAL

## 2015-02-04 MED ORDER — HYDROCODONE-ACETAMINOPHEN 5-325 MG PO TABS
1.0000 | ORAL_TABLET | ORAL | Status: DC | PRN
  Administered 2015-02-04 – 2015-02-05 (×3): 1 via ORAL
  Filled 2015-02-04 (×3): qty 1

## 2015-02-04 NOTE — Progress Notes (Signed)
02/04/2015 10:35 AM  Patient requesting to eat. Stated Neuro MD cleared him. Will speak with Neuro MD and follow up. Will inform the attending MD about the conversation. No new orders placed at this time. Will continue assess and monitor the patient.  [employer], RN-BC, [employer] [facility] [address] Phone [phone]

## 2015-02-04 NOTE — Progress Notes (Signed)
02/04/2015 8:25 AM  Patient for the second time this morning has refused his AM labs. He first refused his labs on night shift at [facility] due to it being to early, per [place] night shift nurse. Now that lab has come back he refused again saying "you guys have stuck me to much". Will inform MD. Will continue to monitor and assess the patient.   [employer], RN-BC, [employer] [facility] [address] Phone [phone]

## 2015-02-04 NOTE — Progress Notes (Signed)
02/04/2015 2:58 PM  Patient complaining of pain and wants something stronger than the prescribed Tylenol. Will inform MD about the new request.   MD stated she would come see the patient when she is available. Will continue to assess and monitor the patient.   [employer], RN-BC, [employer] [facility] [address] Phone [phone]

## 2015-02-04 NOTE — Progress Notes (Signed)
Family Medicine Teaching Service Daily Progress Note Intern Pager: [phone]  Patient name: [name] Medical record number: [id number] Date of birth: 1951-04-27 Age: 64 y.o. Gender: male  Primary Care Provider: Zigmund Gottron, MD Consultants: Neuro  Code Status: FULL   Pt Overview and Major Events to Date:  7/29: admitted for brain mets, given decadron 4mg .   Assessment and Plan: TRYTON BODI is a 64 y.o. male presenting with CT head suggestive for brain metastasis and lung massess. PMH is significant for RCC s/p left nephrectomy 20 yrs ago, tobacco use, COPD, cervical spine d/o, BPD  Brain and Lung masses: Most likely mets from RCC vs other primary CA. Several month h/o amnesia/headache/unintentional weight loss with a h/o smoking 1ppd. Strong family h/o lung and brain cancer. CT with multiple peripherally enhancing lesions with midline shift most likely metastasis from RCC vs lung CA given smoking history. No focal neurologic signs. No fevers, chills, or leukocytosis concerning for infectious etiology. CBC and BMP WNL. s/p Decadron 4 mg on admission for midline shift.  - Neuro consulted, appreciate recs (continue with Decadron?) - Tylenol 650 mg qid prn - AM CMP, CBC hasn't been drawn, per phlebotomy will be drawn this AM - f/u coag labs- not drawn yet.  - Consider CT ab/pelvis pending neuro recs as RCC one of the main possibilities (following CMET results the AM after contrast load/one kidney yesterday) - Consider Pulm consult for lung mass biopsy for better diagnosis  Dyspnea: Likely multifactorial from lung masses, plural effusion, and COPD: CT chest: Extensive nodular pleural masses over the left mid to lower thorax worse along the diaphragmatic border with small left pleural effusion. 3.2 cm pleural mass over the posterior left upper thorax. Multiple bilateral pulmonary nodules with the largest measuring 1.1 cm over the right lower lobe. > 50 pack yr history. No  wheeze or crackle on exam. Currently satting 93-97% on RA.  On fluticasone and albuterol at home.  - Pulmicort BID - albuterol prn  Hip/thigh cramps: intermittent after walking a quarter of mile. Resolves with stopping for about 5 minutes. Possible Leriche syndrome. Has hx of smoking. Can't rule out mets to his spine. Could be simple arthritic changes as well. No pain currently but has been bed bound.  - Lipid panel not obtained yet.  - Consider Vascular dopplers in the future, inpatient vs outpatient.   BPD: Stable on Seroquel 400mg  bid at home - continue home seroquel  FEN/GI: Saline lock, NPO pending neuro recs and possible pulm c/s for bronch?/ protonix  Prophylaxis: SCDs (holding Hep this AM given possible biopsy, restart quickly given pt at increased risk of DVT with high likelihood of malignancy).  Disposition: Pending neuro recs and further work up.  Subjective:  Patient doing well. Frustrated by lack of sleep. Mild dull headache in the occipital region. Breathing ok w/o SOB or cough.   Objective: Temp:  [97.7 F (36.5 C)-98 F (36.7 C)] 97.7 F (36.5 C) (07/30 0456) Pulse Rate:  [64-96] 64 (07/30 0456) Resp:  [16-18] 16 (07/30 0456) BP: (100-123)/(58-70) 116/66 mmHg (07/30 0456) SpO2:  [93 %-97 %] 93 % (07/30 0456) Weight:  [141 lb 5 oz (64.1 kg)-142 lb 4.8 oz (64.547 kg)] 142 lb 4.8 oz (64.547 kg) (07/29 2046) Physical Exam: General: Lying in bed in NAD with blanket over head.  Non-toxic Eyes: Conjunctivae non-injected.  ENTM: Moist mucous membranes. Oropharynx clear. No nasal discharge.  Neck: Supple, no LAD Cardiovascular: RRR. No murmurs, rubs, or gallops noted. No  pitting edema noted. Respiratory: No increased WOB. CTAB without wheezing, rhonchi, or crackles noted. Abdomen: +BS, soft, non-distended, non-tender.  MSK: Normal bulk and noted. No gross deformities noted.  Neuro: A&O x4. Speech clear. PERRL, EOMI, facial movements symmetric. Tongue/uvula midline. 5/5  strength in UE and LE b/l. Normal sensation to light touch. Difficult to illicit reflexes. Fine motor skills intact without dysmetria. Unable to assess Babinski as pt is very ticklish.   Psych:  Appropriate mood and affect.   Laboratory:  Recent Labs Lab 02/03/15 1958  WBC 4.8  HGB 12.3*  HCT 37.0*  PLT 404*    Recent Labs Lab 02/03/15 1958  CREATININE 0.94   UDS: +opiates   EKG: NSR. Twave inversion avR (present prior), some asymmetric tall T waves most like normal variant. Qtc 436  Imaging/Diagnostic Tests: Ct Head W Wo Contrast  02/03/2015   CLINICAL DATA:  Weight loss. Long-term smoker. Abnormal chest x-ray. Multiple pulmonary nodules. Memory loss.  EXAM: CT HEAD WITHOUT AND WITH CONTRAST  TECHNIQUE: Contiguous axial images were obtained from the base of the skull through the vertex without and with intravenous contrast  CONTRAST:  100 mL Omnipaque 300  COMPARISON:  CT head without contrast 02/06/2004.  FINDINGS: A peripherally enhancing mass lesion within the the parietal and occipital lobe exhibits a hyperdense rim on the precontrast images. The lesion measures 3.3 x 2.6 cm in axial dimension.  Additional punctate focus of enhancement is seen more anteriorly within the right temporal lobe on image 9 of series 10.  Extensive vasogenic edema is present in the right temporal, parietal, occipital lobe with effacement of the sulci. Is effacement of the atrium of the right lateral ventricle. Midline shift measures 4 mm at foramen of Monro. Enhancing lesion within the left cerebellar hemisphere measures 10 x 6 mm. Enhancing lead shin at the left IAC a measures 11 x 16 mm, characteristic of a vestibular schwannoma.  A focal soft tissue lesion in the anterior nasal cavity likely represents a polyp. The should be amenable to direct visualization. The paranasal sinuses and mastoid air cells are clear. The calvarium is intact.  IMPRESSION: 1. 3.3 x 2.6 cm peripherally enhancing mass lesion in the  right parietal and occipital lobe with significant surrounding vasogenic edema and mass effect in the right temporal, parietal, and occipital lobe. 2. Midline shift measures 4 mm. 3. Left cerebellar lesion measures 10 x 6 mm. 4. Punctate focus of enhancement in the right temporal lobe likely represents an additional metastasis. 5. 11 x 16 mm enhancing soft tissue lesion at the left IAC is characteristic of the vestibular schwannoma. 6. Probable right-sided nasal polyp. This area should be amenable to direct visualization. These results were called by telephone at the time of interpretation on 02/03/2015 at 3:53 pm to Dr. Madison Hickman , who verbally acknowledged these results.   Electronically Signed   By: San Morelle M.D.   On: 02/03/2015 16:15   Ct Chest W Contrast  02/03/2015   CLINICAL DATA:  Weight loss. Long-term smoker. Abnormal chest x-ray. Productive cough. History of renal cell carcinoma.  EXAM: CT CHEST WITH CONTRAST  TECHNIQUE: Multidetector CT imaging of the chest was performed during intravenous contrast administration.  CONTRAST:  100 mL Omnipaque 300 IV.  COMPARISON:  03/23/2007 and 03/19/2007 as well as chest x-ray 01/27/2015 and abdominal CT 05/04/2010  FINDINGS: Lungs are adequately inflated and demonstrate a small left pleural effusion with multiple enhancing pleural masses over the left lower thorax most prominent in  the lower thorax/lung base adjacent the diaphragm. 3.2 cm enhancing pleural mass over the posterior left upper thorax. These findings are new since 2011 and are concerning for metastatic disease in this patient with history of renal cell carcinoma. There are multiple small bilateral pulmonary parenchymal nodules with the largest measuring 1.1 cm over the right lower lobe compatible with metastatic disease. Surgical suture line over the right apex unchanged. Mild bullous emphysematous changes in the apices. Very minimal likely aspirated debris within the origin of the  right mainstem bronchus.  Heart is normal size. There is a small pericardial effusion. There is calcified plaque over the left main as well as left anterior descending and lateral circumflex coronary arteries. No significant hilar, mediastinal or axillary adenopathy.  Images through the upper abdomen are unremarkable. Remaining bones soft tissues are within normal.  IMPRESSION: Extensive nodular pleural masses over the left mid to lower thorax worse along the diaphragmatic border with small left pleural effusion. 3.2 cm pleural mass over the posterior left upper thorax. Multiple bilateral pulmonary nodules with the largest measuring 1.1 cm over the right lower lobe. These findings are new since 2011 and likely represent metastatic disease in this patient with a known history of renal cell carcinoma.  Small pericardial effusion which is new as cannot exclude a malignant effusion.  Tiny amount of aspirated material over the origin the right mainstem bronchus.  Atherosclerotic coronary artery disease.   Electronically Signed   By: Marin Olp M.D.   On: 02/03/2015 16:01    Archie Patten, MD 02/04/2015, 8:39 AM PGY-2, [place] Intern pager: [phone], text pages welcome

## 2015-02-04 NOTE — Progress Notes (Signed)
02/04/2015 11:10 AM  Patient was spotted by another RN drinking out the faucet to get water.When I asked the patient about it he stated he was and that he also called a friend to come visit and to bring him a hamburger. I informed the patient that he was still NPO. He said he only ate one meal yesterday and he was hungry. Will inform attending MD. Will continue to assess and monitor the patient.  [employer], RN-BC, [employer] [facility] Phone [phone]

## 2015-02-04 NOTE — Progress Notes (Signed)
Initial Nutrition Assessment  DOCUMENTATION CODES:   Not applicable  INTERVENTION:   Provide Ensure Enlive po once daily, each supplement provides 350 kcal and 20 grams of protein.  Encourage adequate PO intake.   NUTRITION DIAGNOSIS:   Increased nutrient needs related to chronic illness as evidenced by estimated needs.  GOAL:   Patient will meet greater than or equal to 90% of their needs  MONITOR:   PO intake, Supplement acceptance, Weight trends, Labs, I & O's  REASON FOR ASSESSMENT:   Malnutrition Screening Tool    ASSESSMENT:   64 y.o. male presenting with CT head suggestive for brain metastasis and Lung massess. PMH is significant for RCC s/p left nephrectomy 20 yrs ago, tobacco use, COPD, cervical spine d/o, BPD  Pt just advanced to a regular diet. Pt reports having a good appetite currently and PTA with consuming of 3 meals daily with no other difficulties. Pt reports recent weight loss with his weight as 147 last week. Pt with a 3% weight loss in 1 week. Pt reports weight loss is due to the brain metastasis. Pt currently has Ensure ordered. RD to continue with current orders.   Nutrition-Focused physical exam completed. Findings are no fat depletion, moderate muscle depletion, and no edema.   Labs and medications reviewed.  Diet Order:  Diet regular Room service appropriate?: Yes; Fluid consistency:: Thin  Skin:  Reviewed, no issues  Last BM:  7/29  Height:   Ht Readings from Last 1 Encounters:  02/03/15 5\' 7"  (1.702 m)    Weight:   Wt Readings from Last 1 Encounters:  02/03/15 142 lb 4.8 oz (64.547 kg)    Ideal Body Weight:  67.27 kg  BMI:  Body mass index is 22.28 kg/(m^2).  Estimated Nutritional Needs:   Kcal:  1800-2000  Protein:  80-90 grams  Fluid:  1.8 - 2 L/day  EDUCATION NEEDS:   No education needs identified at this time  Corrin Parker, MS, RD, LDN Pager # [phone] After hours/ weekend pager # [phone]

## 2015-02-04 NOTE — Consult Note (Signed)
NEURO HOSPITALIST CONSULT NOTE   Referring physician: Dr Dolores Patty Reason for Consult: brain metastasis and need to continue steroids  HPI:                                                                                                                                          [name] is an 64 y.o. male with a past medical history that is relevant for renal cell carcinoma s/p left nephrectomy 20 years ago, heavy smoking, COPD, cervical spine d.o, and BPD, admitted to [facility] with HA and SOB. Work up in the hospital includes CT brain with and without contrast that was personally reviewed and demonstrated 3.3 x 2.6 cm peripherally enhancing mass lesion in the right parietal and occipital lobe with significant surrounding vasogenic edema and mass effect in the right temporal, parietal, and occipital lobe. Midline shift measures 4 mm. Additionally, there is a left cerebellar lesion that measures 10 x 6 mm. A Punctate focus of enhancement in the right temporal lobe likely represents an additional metastasis. 11 x 16 mm enhancing soft tissue lesion at the left IAC is characteristic of the vestibular schwannoma. CT chest: Extensive nodular pleural masses over the left mid to lower thorax worse along the diaphragmatic border with small left pleural effusion. 3.2 cm pleural mass over the posterior left upper thorax. Multiple bilateral pulmonary nodules with the largest measuring 1.1 cm over the right lower lobe. Patient said that he has been experiencing daily intermittent HA without associated nausea or vomiting for several months but it had gotten worse lately. Denies vertigo, double vision, focal weakness or numbness, unsteadiness, slurred speech, language or vision impairment. Unintentional weight loss (about 30lbs in 2 months). He was administered 4 mg IV decadron x 1 dose. Available serologies are not particularly impressive.   History reviewed. No pertinent past medical  history.  History reviewed. No pertinent past surgical history.  History reviewed. No pertinent family history.  Family History: no epilepsy, MS, or brain aneurysm. Hx of brain and lung cancer.   Social History:  reports that he has been smoking.  He does not have any smokeless tobacco history on file. He reports that he drinks alcohol. He reports that he uses illicit drugs.  Allergies  Allergen Reactions  . Codeine     REACTION: nausea    MEDICATIONS:  Scheduled: . budesonide  1 mg Nebulization BID  . feeding supplement (ENSURE ENLIVE)  237 mL Oral BID BM  . heparin  5,000 Units Subcutaneous 3 times per day  . QUEtiapine  400 mg Oral BID  . sodium chloride  3 mL Intravenous Q12H     ROS:                                                                                                                                       History obtained from chart review and the patient  General ROS: negative for - chills, fatigue, fever, night sweats. Unintentional weight loss. Psychological ROS: negative for - behavioral disorder, hallucinations, mood swings or suicidal ideation. Short term memory difficulties. Ophthalmic ROS: negative for - blurry vision, double vision, eye pain or loss of vision ENT ROS: negative for - epistaxis, nasal discharge, oral lesions, sore throat, tinnitus or vertigo Allergy and Immunology ROS: negative for - hives or itchy/watery eyes Hematological and Lymphatic ROS: negative for - bleeding problems, bruising or swollen lymph nodes Endocrine ROS: negative for - galactorrhea, hair pattern changes, polydipsia/polyuria or temperature intolerance Respiratory ROS: negative for - cough, hemoptysis, or wheezing but positive for shortness of breath  Cardiovascular ROS: negative for - chest pain, edema or irregular heartbeat Gastrointestinal ROS: negative for  - abdominal pain, diarrhea, hematemesis, nausea/vomiting or stool incontinence Genito-Urinary ROS: negative for - dysuria, hematuria, incontinence or urinary frequency/urgency Musculoskeletal ROS: negative for - joint swelling or muscular weakness Neurological ROS: as noted in HPI Dermatological ROS: negative for rash and skin lesion changes  Physical exam: pleasant male in no apparent distress. Blood pressure 116/66, pulse 64, temperature 97.7 F (36.5 C), temperature source Oral, resp. rate 16, height 5' 7"  (1.702 m), weight 64.547 kg (142 lb 4.8 oz), SpO2 93 %. Head: normocephalic. Neck: supple, no bruits, no JVD. Cardiac: no murmurs. Lungs: clear. Abdomen: soft, no tender, no mass. Extremities: no edema. Skin: no rash  Neurologic Examination:                                                                                                      General: Mental Status: Alert, oriented, thought content appropriate.  Speech fluent without evidence of aphasia.  Able to follow 3 step commands without difficulty. Cranial Nerves: II: Discs flat bilaterally; Visual fields grossly normal, pupils equal, round, reactive to light and accommodation III,IV, VI: ptosis not present, extra-ocular motions intact bilaterally V,VII: smile symmetric, facial light touch sensation normal bilaterally  VIII: hearing normal bilaterally IX,X: uvula rises symmetrically XI: bilateral shoulder shrug XII: midline tongue extension without atrophy or fasciculations  Motor: Right : Upper extremity   5/5    Left:     Upper extremity   5/5  Lower extremity   5/5     Lower extremity   5/5 Tone and bulk:normal tone throughout; no atrophy noted Sensory: Pinprick and light touch intact throughout, bilaterally Deep Tendon Reflexes:  Hypoactive all over Plantars: Right: downgoing   Left: downgoing Cerebellar: normal finger-to-nose,  normal heel-to-shin test Gait:  No tested due to multiple leads, safety reasons Lab  Results  Component Value Date/Time   CHOL 167 03/13/2011 04:16 PM    Results for orders placed or performed during the hospital encounter of 02/03/15 (from the past 48 hour(s))  CBC     Status: Abnormal   Collection Time: 02/03/15  7:58 PM  Result Value Ref Range   WBC 4.8 4.0 - 10.5 K/uL   RBC 4.29 4.22 - 5.81 MIL/uL   Hemoglobin 12.3 (L) 13.0 - 17.0 g/dL   HCT 37.0 (L) 39.0 - 52.0 %   MCV 86.2 78.0 - 100.0 fL   MCH 28.7 26.0 - 34.0 pg   MCHC 33.2 30.0 - 36.0 g/dL   RDW 13.2 11.5 - 15.5 %   Platelets 404 (H) 150 - 400 K/uL  Creatinine, serum     Status: None   Collection Time: 02/03/15  7:58 PM  Result Value Ref Range   Creatinine, Ser 0.94 0.61 - 1.24 mg/dL   GFR calc non Af Amer >60 >60 mL/min   GFR calc Af Amer >60 >60 mL/min    Comment: (NOTE) The eGFR has been calculated using the CKD EPI equation. This calculation has not been validated in all clinical situations. eGFR's persistently <60 mL/min signify possible Chronic Kidney Disease.   Urine rapid drug screen (hosp performed)     Status: Abnormal   Collection Time: 02/04/15  7:28 AM  Result Value Ref Range   Opiates POSITIVE (A) NONE DETECTED   Cocaine NONE DETECTED NONE DETECTED   Benzodiazepines NONE DETECTED NONE DETECTED   Amphetamines NONE DETECTED NONE DETECTED   Tetrahydrocannabinol NONE DETECTED NONE DETECTED   Barbiturates NONE DETECTED NONE DETECTED    Comment:        DRUG SCREEN FOR MEDICAL PURPOSES ONLY.  IF CONFIRMATION IS NEEDED FOR ANY PURPOSE, NOTIFY LAB WITHIN 5 DAYS.        LOWEST DETECTABLE LIMITS FOR URINE DRUG SCREEN Drug Class       Cutoff (ng/mL) Amphetamine      1000 Barbiturate      200 Benzodiazepine   979 Tricyclics       892 Opiates          300 Cocaine          300 THC              50     Ct Head W Wo Contrast  02/03/2015   CLINICAL DATA:  Weight loss. Long-term smoker. Abnormal chest x-ray. Multiple pulmonary nodules. Memory loss.  EXAM: CT HEAD WITHOUT AND WITH CONTRAST   TECHNIQUE: Contiguous axial images were obtained from the base of the skull through the vertex without and with intravenous contrast  CONTRAST:  100 mL Omnipaque 300  COMPARISON:  CT head without contrast 02/06/2004.  FINDINGS: A peripherally enhancing mass lesion within the the parietal and occipital lobe exhibits a hyperdense rim on the precontrast images. The  lesion measures 3.3 x 2.6 cm in axial dimension.  Additional punctate focus of enhancement is seen more anteriorly within the right temporal lobe on image 9 of series 10.  Extensive vasogenic edema is present in the right temporal, parietal, occipital lobe with effacement of the sulci. Is effacement of the atrium of the right lateral ventricle. Midline shift measures 4 mm at foramen of Monro. Enhancing lesion within the left cerebellar hemisphere measures 10 x 6 mm. Enhancing lead shin at the left IAC a measures 11 x 16 mm, characteristic of a vestibular schwannoma.  A focal soft tissue lesion in the anterior nasal cavity likely represents a polyp. The should be amenable to direct visualization. The paranasal sinuses and mastoid air cells are clear. The calvarium is intact.  IMPRESSION: 1. 3.3 x 2.6 cm peripherally enhancing mass lesion in the right parietal and occipital lobe with significant surrounding vasogenic edema and mass effect in the right temporal, parietal, and occipital lobe. 2. Midline shift measures 4 mm. 3. Left cerebellar lesion measures 10 x 6 mm. 4. Punctate focus of enhancement in the right temporal lobe likely represents an additional metastasis. 5. 11 x 16 mm enhancing soft tissue lesion at the left IAC is characteristic of the vestibular schwannoma. 6. Probable right-sided nasal polyp. This area should be amenable to direct visualization. These results were called by telephone at the time of interpretation on 02/03/2015 at 3:53 pm to Dr. Madison Hickman , who verbally acknowledged these results.   Electronically Signed   By: San Morelle M.D.   On: 02/03/2015 16:15   Ct Chest W Contrast  02/03/2015   CLINICAL DATA:  Weight loss. Long-term smoker. Abnormal chest x-ray. Productive cough. History of renal cell carcinoma.  EXAM: CT CHEST WITH CONTRAST  TECHNIQUE: Multidetector CT imaging of the chest was performed during intravenous contrast administration.  CONTRAST:  100 mL Omnipaque 300 IV.  COMPARISON:  03/23/2007 and 03/19/2007 as well as chest x-ray 01/27/2015 and abdominal CT 05/04/2010  FINDINGS: Lungs are adequately inflated and demonstrate a small left pleural effusion with multiple enhancing pleural masses over the left lower thorax most prominent in the lower thorax/lung base adjacent the diaphragm. 3.2 cm enhancing pleural mass over the posterior left upper thorax. These findings are new since 2011 and are concerning for metastatic disease in this patient with history of renal cell carcinoma. There are multiple small bilateral pulmonary parenchymal nodules with the largest measuring 1.1 cm over the right lower lobe compatible with metastatic disease. Surgical suture line over the right apex unchanged. Mild bullous emphysematous changes in the apices. Very minimal likely aspirated debris within the origin of the right mainstem bronchus.  Heart is normal size. There is a small pericardial effusion. There is calcified plaque over the left main as well as left anterior descending and lateral circumflex coronary arteries. No significant hilar, mediastinal or axillary adenopathy.  Images through the upper abdomen are unremarkable. Remaining bones soft tissues are within normal.  IMPRESSION: Extensive nodular pleural masses over the left mid to lower thorax worse along the diaphragmatic border with small left pleural effusion. 3.2 cm pleural mass over the posterior left upper thorax. Multiple bilateral pulmonary nodules with the largest measuring 1.1 cm over the right lower lobe. These findings are new since 2011 and likely represent  metastatic disease in this patient with a known history of renal cell carcinoma.  Small pericardial effusion which is new as cannot exclude a malignant effusion.  Tiny amount of aspirated material  over the origin the right mainstem bronchus.  Atherosclerotic coronary artery disease.   Electronically Signed   By: Marin Olp M.D.   On: 02/03/2015 16:01   Assessment/Plan: 64 y/o with  renal cell carcinoma s/p left nephrectomy 20 years ago, heavy smoker, presents with chronic daily intermittent HA and SOB. CT brain with multifocal brain lesions, the largest one involving the right parietal and occipital lobe where there is significant surrounding vasogenic edema and mass effect in the right temporal, parietal, and occipital lobe. Midline shift measures 4 mm. Further, CT chest showed extensive nodular pleural masses over the left mid to lower thorax worse along the diaphragmatic border with small left pleural effusion. 3.2 cm pleural mass over the posterior left upper thorax. Multiple bilateral pulmonary nodules. 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 Although these brain metastatic lesions are not hemorrhagic, can not entirely exclude RCC as being the primary. Recommend: 1) Decadron 4 mg every 6 hours, as patient is still symptomatic and there is significant vasogenic edema with some degree of midline shift on CT brain 2) CT abdomen/pelvis 3) MRI brain with and without contrast may be able to identify more diffuse metastatic involvement. 4) Evaluation by oncology and neurosurgery.   Dorian Pod, MD 02/04/2015, 10:02 AM  [facility]

## 2015-02-05 ENCOUNTER — Inpatient Hospital Stay (HOSPITAL_COMMUNITY): Payer: Medicare Other

## 2015-02-05 ENCOUNTER — Encounter (HOSPITAL_COMMUNITY): Payer: Self-pay | Admitting: Radiology

## 2015-02-05 DIAGNOSIS — Z515 Encounter for palliative care: Secondary | ICD-10-CM | POA: Insufficient documentation

## 2015-02-05 LAB — CBC
HEMATOCRIT: 40.5 % (ref 39.0–52.0)
Hemoglobin: 13.5 g/dL (ref 13.0–17.0)
MCH: 29.2 pg (ref 26.0–34.0)
MCHC: 33.3 g/dL (ref 30.0–36.0)
MCV: 87.5 fL (ref 78.0–100.0)
PLATELETS: 471 10*3/uL — AB (ref 150–400)
RBC: 4.63 MIL/uL (ref 4.22–5.81)
RDW: 13.2 % (ref 11.5–15.5)
WBC: 8.6 10*3/uL (ref 4.0–10.5)

## 2015-02-05 LAB — BASIC METABOLIC PANEL
Anion gap: 9 (ref 5–15)
BUN: 8 mg/dL (ref 6–20)
CO2: 28 mmol/L (ref 22–32)
Calcium: 9.8 mg/dL (ref 8.9–10.3)
Chloride: 98 mmol/L — ABNORMAL LOW (ref 101–111)
Creatinine, Ser: 0.78 mg/dL (ref 0.61–1.24)
GFR calc Af Amer: >60 mL/min (ref >60)
GLUCOSE: 163 mg/dL — AB (ref 65–99)
Potassium: 4.5 mmol/L (ref 3.5–5.1)
SODIUM: 135 mmol/L (ref 135–145)

## 2015-02-05 MED ORDER — DEXAMETHASONE 4 MG PO TABS: 4.0000 mg | ORAL_TABLET | Freq: Two times a day (BID) | ORAL | Status: DC

## 2015-02-05 MED ORDER — IOHEXOL 300 MG/ML  SOLN
100.0000 mL | Freq: Once | INTRAMUSCULAR | Status: AC | PRN
  Administered 2015-02-05: 100 mL via INTRAVENOUS

## 2015-02-05 MED ORDER — IOHEXOL 300 MG/ML  SOLN
25.0000 mL | INTRAMUSCULAR | Status: AC
Start: 2015-02-05 — End: 2015-02-05
  Administered 2015-02-05 (×2): 25 mL via ORAL

## 2015-02-05 MED ORDER — HYDROCODONE-ACETAMINOPHEN 5-325 MG PO TABS: 1.0000 | ORAL_TABLET | ORAL | Status: DC | PRN

## 2015-02-05 NOTE — Discharge Instructions (Signed)
Hospice °Hospice is a service that is designed to provide people who are terminally ill and their families with medical, spiritual, and psychological support. Its aim is to improve your quality of life by keeping you as alert and comfortable as possible. Hospice is performed by a team of health care professionals and volunteers who: °· Help keep you comfortable. Hospice can be provided in your home or in a homelike setting. The hospice staff works with your family and friends to help meet your needs. You will enjoy the support of loved ones by receiving much of your basic care from family and friends. °· Provide pain relief and manage your symptoms. The staff supply all necessary medicines and equipment. °· Provide companionship when you are alone. °· Allow you and your family to rest. They may do light housekeeping, prepare meals, and run errands. °· Provide counseling. They will make sure your emotional, spiritual, and social needs and those of your family are being met. °· Provide spiritual care. Spiritual care is individualized to meet your needs and your family's needs. It may involve helping you look at what death means to you, say goodbye, or perform a specific religious ceremony or ritual. °Hospice teams often include: °· A nurse. °· A doctor. °· Social workers. °· Religious leaders (such as a chaplain). °· Trained volunteers. °WHEN SHOULD HOSPICE CARE BEGIN? °Most people who use hospice are believed to have fewer than 6 months to live. Your family and health care providers can help you decide when hospice services should begin. If your condition improves, you may discontinue the program. °WHAT SHOULD I CONSIDER BEFORE SELECTING A PROGRAM? °Most hospice programs are run by nonprofit, independent organizations. Some are affiliated with hospitals, nursing homes, or home health care agencies. Hospice programs can take place in the home or at a hospice center, hospital, or skilled nursing facility. When choosing  a hospice program, ask the following questions: °· What services are available to me? °· What services are offered to my loved ones? °· How involved are my loved ones? °· How involved is my health care provider? °· Who makes up the hospice care team? How are they trained or screened? °· How will my pain and symptoms be managed? °· If my circumstances change, can the services be provided in a different setting, such as my home or in the hospital? °· Is the program reviewed and licensed by the state or certified in some other way? °WHERE CAN I LEARN MORE ABOUT HOSPICE? °You can learn about existing hospice programs in your area from your health care providers. You can also read more about hospice online. The websites of the following organizations contain helpful information: °· The National Hospice and Palliative Care Organization (NHPCO). °· The Hospice Association of America (HAA). °· The Hospice Education Institute. °· The American Cancer Society (ACS). °· Hospice Net. °Document Released: 10/11/2003 Document Revised: 06/29/2013 Document Reviewed: 05/04/2013 °ExitCare® Patient Information ©2015 [employer]. This information is not intended to replace advice given to you by your health care provider. Make sure you discuss any questions you have with your health care provider. ° °

## 2015-02-05 NOTE — Consult Note (Signed)
Name: [name] MRN: [id number] DOB: [birth year]    ADMISSION DATE:  02/03/2015 CONSULTATION DATE:  02/05/2015  REFERRING MD :  FPTS  BRIEF CONSULT CHART Review.  No patient billing  CHIEF COMPLAINT:   Abn CT Chest   BRIEF PATIENT DESCRIPTION:  64 y.o. M prior RCC s/p nephrectomy 54yrs ago.  Ongoing tobacco use and copd. Now with CNS masses and lung nodules/pleural based masses.  Pccm consulted ? Best DX approach. Pt adm with neuro changes, headaches, weight loss.  Found to have CNS lesions on CT scan now on decadron.      PAST MEDICAL HISTORY :   has no past medical history on file.  has no past surgical history on file. Prior to Admission medications   Medication Sig Start Date End Date Taking? Authorizing Provider  QUEtiapine (SEROQUEL) 400 MG tablet TAKE 1 TABLET (400 MG TOTAL) BY MOUTH 2 (TWO) TIMES DAILY. Patient taking differently: TAKE 1 TABLETdaily at bedtime 11/25/14  Yes Zenia Resides, MD  albuterol (VENTOLIN HFA) 108 (90 BASE) MCG/ACT inhaler Inhale 2 puffs into the lungs every 4 (four) hours as needed. For shortness of breath or wheezing. 10/16/11   Zenia Resides, MD   Allergies  Allergen Reactions  . Codeine Nausea Only    FAMILY HISTORY:  family history is not on file. SOCIAL HISTORY:  reports that he has been smoking.  He does not have any smokeless tobacco history on file. He reports that he drinks alcohol. He reports that he uses illicit drugs.  REVIEW OF SYSTEMS:   NOT performed.     VITAL SIGNS: Temp:  [97.5 F (36.4 C)-98 F (36.7 C)] 97.7 F (36.5 C) (07/31 0704) Pulse Rate:  [59-67] 59 (07/31 0704) Resp:  [18] 18 (07/31 0704) BP: (93-142)/(54-75) 119/54 mmHg (07/31 0704) SpO2:  [95 %-98 %] 95 % (07/31 0704) Weight:  [65.953 kg (145 lb 6.4 oz)] 65.953 kg (145 lb 6.4 oz) (07/31 0704)  Physical exam deferred: brief consult , chart review only  Recent Labs Lab 02/03/15 1958 02/04/15 1400 02/05/15 0524  NA  --  134* 135  K   --  3.9 4.5  CL  --  100* 98*  CO2  --  25 28  BUN  --  7 8  CREATININE 0.94 0.85 0.78  GLUCOSE  --  110* 163*    Recent Labs Lab 02/03/15 1958 02/04/15 1400 02/05/15 0524  HGB 12.3* 13.5 13.5  HCT 37.0* 40.5 40.5  WBC 4.8 7.4 8.6  PLT 404* 427* 471*  ALL IMAGES REVIEWED  Ct Head W Wo Contrast  02/03/2015   CLINICAL DATA:  Weight loss. Long-term smoker. Abnormal chest x-ray. Multiple pulmonary nodules. Memory loss.  EXAM: CT HEAD WITHOUT AND WITH CONTRAST  TECHNIQUE: Contiguous axial images were obtained from the base of the skull through the vertex without and with intravenous contrast  CONTRAST:  100 mL Omnipaque 300  COMPARISON:  CT head without contrast 02/06/2004.  FINDINGS: A peripherally enhancing mass lesion within the the parietal and occipital lobe exhibits a hyperdense rim on the precontrast images. The lesion measures 3.3 x 2.6 cm in axial dimension.  Additional punctate focus of enhancement is seen more anteriorly within the right temporal lobe on image 9 of series 10.  Extensive vasogenic edema is present in the right temporal, parietal, occipital lobe with effacement of the sulci. Is effacement of the atrium of the right lateral ventricle. Midline shift measures 4 mm at foramen of Monro.  Enhancing lesion within the left cerebellar hemisphere measures 10 x 6 mm. Enhancing lead shin at the left IAC a measures 11 x 16 mm, characteristic of a vestibular schwannoma.  A focal soft tissue lesion in the anterior nasal cavity likely represents a polyp. The should be amenable to direct visualization. The paranasal sinuses and mastoid air cells are clear. The calvarium is intact.  IMPRESSION: 1. 3.3 x 2.6 cm peripherally enhancing mass lesion in the right parietal and occipital lobe with significant surrounding vasogenic edema and mass effect in the right temporal, parietal, and occipital lobe. 2. Midline shift measures 4 mm. 3. Left cerebellar lesion measures 10 x 6 mm. 4. Punctate focus of  enhancement in the right temporal lobe likely represents an additional metastasis. 5. 11 x 16 mm enhancing soft tissue lesion at the left IAC is characteristic of the vestibular schwannoma. 6. Probable right-sided nasal polyp. This area should be amenable to direct visualization. These results were called by telephone at the time of interpretation on 02/03/2015 at 3:53 pm to Dr. Madison Hickman , who verbally acknowledged these results.   Electronically Signed   By: San Morelle M.D.   On: 02/03/2015 16:15   Ct Chest W Contrast  02/03/2015   CLINICAL DATA:  Weight loss. Long-term smoker. Abnormal chest x-ray. Productive cough. History of renal cell carcinoma.  EXAM: CT CHEST WITH CONTRAST  TECHNIQUE: Multidetector CT imaging of the chest was performed during intravenous contrast administration.  CONTRAST:  100 mL Omnipaque 300 IV.  COMPARISON:  03/23/2007 and 03/19/2007 as well as chest x-ray 01/27/2015 and abdominal CT 05/04/2010  FINDINGS: Lungs are adequately inflated and demonstrate a small left pleural effusion with multiple enhancing pleural masses over the left lower thorax most prominent in the lower thorax/lung base adjacent the diaphragm. 3.2 cm enhancing pleural mass over the posterior left upper thorax. These findings are new since 2011 and are concerning for metastatic disease in this patient with history of renal cell carcinoma. There are multiple small bilateral pulmonary parenchymal nodules with the largest measuring 1.1 cm over the right lower lobe compatible with metastatic disease. Surgical suture line over the right apex unchanged. Mild bullous emphysematous changes in the apices. Very minimal likely aspirated debris within the origin of the right mainstem bronchus.  Heart is normal size. There is a small pericardial effusion. There is calcified plaque over the left main as well as left anterior descending and lateral circumflex coronary arteries. No significant hilar, mediastinal or  axillary adenopathy.  Images through the upper abdomen are unremarkable. Remaining bones soft tissues are within normal.  IMPRESSION: Extensive nodular pleural masses over the left mid to lower thorax worse along the diaphragmatic border with small left pleural effusion. 3.2 cm pleural mass over the posterior left upper thorax. Multiple bilateral pulmonary nodules with the largest measuring 1.1 cm over the right lower lobe. These findings are new since 2011 and likely represent metastatic disease in this patient with a known history of renal cell carcinoma.  Small pericardial effusion which is new as cannot exclude a malignant effusion.  Tiny amount of aspirated material over the origin the right mainstem bronchus.  Atherosclerotic coronary artery disease.   Electronically Signed   By: Marin Olp M.D.   On: 02/03/2015 16:01    ASSESSMENT / PLAN:  #1 Lung nodules and Pleural based masses.    REC:  I would recommend IR transthoracic CT guided needle biopsy of the Left lower lobe pleural based mass as best Dx  option.  I suspect the disease in the chest and CNS are the same entity.  I agree with getting RAD and MED Onc and Neurosurgery input.   PCCM is available PRN  Glyn Ade  [phone]  Cell  [phone]  If no response or cell goes to voicemail, call beeper [phone]  [facility] and [place] Pager: [phone]  02/05/2015, 9:49 AM

## 2015-02-05 NOTE — Progress Notes (Signed)
Daily Progress Note   Patient Name: [name]       Date: 02/05/2015 DOB: 01-01-1951  Age: 64 y.o. MRN#: [id number] Attending Physician: Leeanne Rio, MD Primary Care Physician: Zigmund Gottron, MD Admit Date: 02/03/2015  Reason for Consultation/Follow-up: Establishing goals of care  Subjective: Pt is a 64 yo man with new dx of pulmonary malignancy with CNS involvement with midline shift. Having HA's that are relieved with 1 Vicodin. Now started on decadron 4 mg q6 atc by neurology. He states he is going for more testing to determine disease extent. Appetite OK, and he states he was able to sleep. He used 2 Vicodin yesterday and 1 so far today. Still difficult to get him to talk and not watch the TV, and getting multiple phone calls. He tells the people on the phone he is dying, " I just want to go home and go fishing". He is still planning on going through with lung BX at this point, and testing but does state he doesn't see the purpose since there " is nothing anyone can do".  Interval Events: none Length of Stay: 2 days  Current Medications: Scheduled Meds:  . budesonide  1 mg Nebulization BID  . dexamethasone  4 mg Intravenous 4 times per day  . feeding supplement (ENSURE ENLIVE)  237 mL Oral Q1500  . QUEtiapine  400 mg Oral QHS  . sodium chloride  3 mL Intravenous Q12H    Continuous Infusions: . sodium chloride 10 mL/hr at 02/03/15 2234    PRN Meds: sodium chloride, acetaminophen **OR** acetaminophen, albuterol, HYDROcodone-acetaminophen, sodium chloride  Palliative Performance Scale: 70%     Vital Signs: BP 131/62 mmHg  Pulse 66  Temp(Src) 97.5 F (36.4 C) (Oral)  Resp 20  Ht 5\' 7"  (1.702 m)  Wt 65.953 kg (145 lb 6.4 oz)  BMI 22.77 kg/m2  SpO2 100% SpO2: SpO2: 100 % O2 Device: O2 Device: Not Delivered O2 Flow Rate:    Intake/output summary:  Intake/Output Summary (Last 24 hours) at 02/05/15 1134 Last data filed at 02/05/15 1027  Gross  per 24 hour  Intake   1680 ml  Output    600 ml  Net   1080 ml   LBM:   Baseline Weight: Weight: 64.1 kg (141 lb 5 oz) Most recent weight: Weight: 65.953 kg (145 lb 6.4 oz)  Physical Exam: General: Older man, lying in bed watching TV. No acute distress Resp: Clear. No work of breathing Cardiac: RRR Psych: Eye contact poor, irritable but unchanged from yesterday              Additional Data Reviewed: Recent Labs     02/04/15  1400  02/05/15  0524  WBC  7.4  8.6  HGB  13.5  13.5  PLT  427*  471*  NA  134*  135  BUN  7  8  CREATININE  0.85  0.78     Problem List:  Patient Active Problem List   Diagnosis Date Noted  . Palliative care encounter   . Metastasis to brain   . Malignant pleural effusion   . Headache due to intracranial disease   . Primary cancer of unknown site   . Bipolar affective disorder, currently depressed, moderate   . Shortness of breath 02/03/2015  . Neoplasm of uncertain behavior of trachea, bronchus, and lung 01/30/2015  . Memory loss 01/30/2015  . Weight loss, unintentional 01/27/2015  . Screening for HIV (human immunodeficiency virus) 01/27/2015  .  History of drug abuse 11/30/2010  . 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 TOBACCO USER 03/25/2009  . 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 History of malignant neoplasm of kidney 03/16/2007  . BIPOLAR DISORDER 09/04/2006  . CERVICAL SPINE DISORDER, NOS 09/04/2006     Palliative Care Assessment & Plan    Code Status:  Full code  Goals of Care:  Still obtaining information. I have not discussed code status yet as he has not seen oncologist, BX not in and further scans pending  Desire for further Chaplaincy support:no  3. Symptom Management:  Headache: Improving on PRN Vicodin and decadron  Bipolar: Cont Seroquel and monitor for worsening mood lability   5. Prognosis: Unable to determine  5. Discharge Planning: Unable to determine yet   Thank you for allowing the Palliative Medicine Team to  assist in the care of this patient.   Time In: 1100 Time Out: 1125 Total Time 25 min Prolonged Time Billed  no     Greater than 50%  of this time was spent counseling and coordinating care related to the above assessment and plan.   Dory Horn, NP  02/05/2015, 11:34 AM  Please contact Palliative Medicine Team phone at [phone] for questions and concerns.

## 2015-02-05 NOTE — Discharge Summary (Signed)
[facility] Discharge Summary  Patient name: [name] Medical record number: [id number] Date of birth: May 31, 1951 Age: 64 y.o. Gender: male Date of Admission: 02/03/2015  Date of Discharge: 02/05/2015 Admitting Physician: Zenia Resides, MD  Primary Care Provider: Zigmund Gottron, MD Consultants: Neurology  Indication for Hospitalization: Brain and lung mets  Discharge Diagnoses/Problem List:  Brain and lung masses, COPD, hip/thigh cramps, bipolar  Disposition: Home  Discharge Condition: Stable  Discharge Exam: Please see progress note for day of discharge.  Brief Hospital Course:  [name] is a 64 year old man who was directly admitted after having a CT chest and CT head suggestive of brain metastasis and lung masses. Initial head CT also showed some mass effect with midline shift. Neurology was consulted and the patient was started on decadron. Palliative care was also consulted given the patient's poor prognosis. Patient initially leaned towards aggressive work-up. CT abdomen revealed a likely metastatic lesion to the patient's pancreas. We consulted our pulmonology team who recommended a CT guided biopsy of the patient's left lower lobe pleural mass for tissue diagnosis. After much discussion with the palliative care team and the patient's PCP, the patient made the decision to forego further aggressive work up and instead wished to be treated for comfort. Patient was subsequently discharged home with PO decadron and pain medications. We additionally consulted our care manager to help set the patient up with hospice prior to discharge.  Issues for Follow Up:  1. Make sure patient has been set up with outpatient hospice  Significant Procedures: None  Significant Labs and Imaging:   Recent Labs Lab 02/03/15 1958 02/04/15 1400 02/05/15 0524  WBC 4.8 7.4 8.6  HGB 12.3* 13.5 13.5  HCT 37.0* 40.5 40.5  PLT 404* 427* 471*     Recent Labs Lab 02/03/15 1958 02/04/15 1400 02/05/15 0524  NA  --  134* 135  K  --  3.9 4.5  CL  --  100* 98*  CO2  --  25 28  GLUCOSE  --  110* 163*  BUN  --  7 8  CREATININE 0.94 0.85 0.78  CALCIUM  --  9.6 9.8  ALKPHOS  --  112  --   AST  --  10*  --   ALT  --  12*  --   ALBUMIN  --  2.7*  --    UDS: +opiates EKG: NSR  Ct Head W Wo Contrast  02/03/2015   CLINICAL DATA:  Weight loss. Long-term smoker. Abnormal chest x-ray. Multiple pulmonary nodules. Memory loss.  EXAM: CT HEAD WITHOUT AND WITH CONTRAST  TECHNIQUE: Contiguous axial images were obtained from the base of the skull through the vertex without and with intravenous contrast  CONTRAST:  100 mL Omnipaque 300  COMPARISON:  CT head without contrast 02/06/2004.  FINDINGS: A peripherally enhancing mass lesion within the the parietal and occipital lobe exhibits a hyperdense rim on the precontrast images. The lesion measures 3.3 x 2.6 cm in axial dimension.  Additional punctate focus of enhancement is seen more anteriorly within the right temporal lobe on image 9 of series 10.  Extensive vasogenic edema is present in the right temporal, parietal, occipital lobe with effacement of the sulci. Is effacement of the atrium of the right lateral ventricle. Midline shift measures 4 mm at foramen of Monro. Enhancing lesion within the left cerebellar hemisphere measures 10 x 6 mm. Enhancing lead shin at the left IAC a measures 11 x 16  mm, characteristic of a vestibular schwannoma.  A focal soft tissue lesion in the anterior nasal cavity likely represents a polyp. The should be amenable to direct visualization. The paranasal sinuses and mastoid air cells are clear. The calvarium is intact.  IMPRESSION: 1. 3.3 x 2.6 cm peripherally enhancing mass lesion in the right parietal and occipital lobe with significant surrounding vasogenic edema and mass effect in the right temporal, parietal, and occipital lobe. 2. Midline shift measures 4 mm. 3. Left  cerebellar lesion measures 10 x 6 mm. 4. Punctate focus of enhancement in the right temporal lobe likely represents an additional metastasis. 5. 11 x 16 mm enhancing soft tissue lesion at the left IAC is characteristic of the vestibular schwannoma. 6. Probable right-sided nasal polyp. This area should be amenable to direct visualization. These results were called by telephone at the time of interpretation on 02/03/2015 at 3:53 pm to Dr. Madison Hickman , who verbally acknowledged these results.   Electronically Signed   By: San Morelle M.D.   On: 02/03/2015 16:15   Ct Chest W Contrast  02/03/2015   CLINICAL DATA:  Weight loss. Long-term smoker. Abnormal chest x-ray. Productive cough. History of renal cell carcinoma.  EXAM: CT CHEST WITH CONTRAST  TECHNIQUE: Multidetector CT imaging of the chest was performed during intravenous contrast administration.  CONTRAST:  100 mL Omnipaque 300 IV.  COMPARISON:  03/23/2007 and 03/19/2007 as well as chest x-ray 01/27/2015 and abdominal CT 05/04/2010  FINDINGS: Lungs are adequately inflated and demonstrate a small left pleural effusion with multiple enhancing pleural masses over the left lower thorax most prominent in the lower thorax/lung base adjacent the diaphragm. 3.2 cm enhancing pleural mass over the posterior left upper thorax. These findings are new since 2011 and are concerning for metastatic disease in this patient with history of renal cell carcinoma. There are multiple small bilateral pulmonary parenchymal nodules with the largest measuring 1.1 cm over the right lower lobe compatible with metastatic disease. Surgical suture line over the right apex unchanged. Mild bullous emphysematous changes in the apices. Very minimal likely aspirated debris within the origin of the right mainstem bronchus.  Heart is normal size. There is a small pericardial effusion. There is calcified plaque over the left main as well as left anterior descending and lateral circumflex  coronary arteries. No significant hilar, mediastinal or axillary adenopathy.  Images through the upper abdomen are unremarkable. Remaining bones soft tissues are within normal.  IMPRESSION: Extensive nodular pleural masses over the left mid to lower thorax worse along the diaphragmatic border with small left pleural effusion. 3.2 cm pleural mass over the posterior left upper thorax. Multiple bilateral pulmonary nodules with the largest measuring 1.1 cm over the right lower lobe. These findings are new since 2011 and likely represent metastatic disease in this patient with a known history of renal cell carcinoma.  Small pericardial effusion which is new as cannot exclude a malignant effusion.  Tiny amount of aspirated material over the origin the right mainstem bronchus.  Atherosclerotic coronary artery disease.   Electronically Signed   By: Marin Olp M.D.   On: 02/03/2015 16:01   Ct Abdomen Pelvis W Contrast  02/05/2015   CLINICAL DATA:  Weight loss. Long-term smoker. Abnormal chest x-ray.Productive cough. History of renal cell carcinoma.  EXAM: CT ABDOMEN AND PELVIS WITH CONTRAST  TECHNIQUE: Multidetector CT imaging of the abdomen and pelvis was performed using the standard protocol following bolus administration of intravenous contrast.  CONTRAST:  166mL OMNIPAQUE IOHEXOL  300 MG/ML  SOLN  COMPARISON:  Chest CT 02/03/2015, head CT 02/03/2015  FINDINGS: Lower chest: Again demonstrated multi lobulated peripheral enhancing and irregular fluid collections within the inferior LEFT pleural space consistent with pleural metastasis. Nodule at the RIGHT lung base measuring 9 mm is concerning for metastatic pulmonary lesion.  Hepatobiliary:  No focal hepatic lesion.  Post cholecystectomy.  Pancreas: There is a fullness within the mid pancreatic body measuring 3.0 x 2.4 cm on image 28, series 2. Proximal to this fullness the pancreatic body and tail is mildly atrophic. Distal to this fullness the pancreatic head is  mildly atrophic. There is no ductal dilatation evident.  Spleen: Normal spleen  Adrenals/urinary tract: Adrenal glands are normal. Patient status post LEFT nephrectomy. No nodularity nephrectomy bed. The RIGHT kidney is normal. There is a nonobstructing calculus in the lower pole of the RIGHT kidney measuring 13 mm.  Stomach/Bowel: The stomach, small bowel are and cecum normal. Appendix is normal. Colon and rectosigmoid colon are normal.  Vascular/Lymphatic: Abdominal aorta is normal caliber. There is no retroperitoneal or periportal lymphadenopathy. No pelvic lymphadenopathy.  Reproductive: Prostate normal.  Musculoskeletal: Internal fixation of the LEFT proximal femur. Lytic lesion in the LEFT iliac bone is unchanged from CT of 06/04/2010.  Other: No free fluid.  IMPRESSION: 1. Metastatic pleural disease in the LEFT lower lobe and metastatic pulmonary nodule in the RIGHT lower lobe. 2. Pancreatic body lesion is concerning for a neoplastic lesion. Differential would include a primary pancreatic lesion versus a metastatic lesion. Favor metastatic lesion as there is no duct dilatation. 3. No evidence of local recurrence within the LEFT nephrectomy bed.   Electronically Signed   By: Suzy Bouchard M.D.   On: 02/05/2015 12:56   Results/Tests Pending at Time of Discharge: None  Discharge Medications:    Medication List    TAKE these medications        albuterol 108 (90 BASE) MCG/ACT inhaler  Commonly known as:  VENTOLIN HFA  Inhale 2 puffs into the lungs every 4 (four) hours as needed. For shortness of breath or wheezing.     dexamethasone 4 MG tablet  Commonly known as:  DECADRON  Take 1 tablet (4 mg total) by mouth 2 (two) times daily with a meal.     HYDROcodone-acetaminophen 5-325 MG per tablet  Commonly known as:  NORCO/VICODIN  Take 1 tablet by mouth every 4 (four) hours as needed for moderate pain.     QUEtiapine 400 MG tablet  Commonly known as:  SEROQUEL  TAKE 1 TABLET (400 MG TOTAL)  BY MOUTH 2 (TWO) TIMES DAILY.        Discharge Instructions: Please refer to Patient Instructions section of EMR for full details.  Patient was counseled important signs and symptoms that should prompt return to medical care, changes in medications, dietary instructions, activity restrictions, and follow up appointments.   Follow-Up Appointments: Follow-up Information    Follow up with Zigmund Gottron, MD On 02/10/2015.   Specialty:  Family Medicine   Why:  8:45AM   Contact information:   [place] [address] [phone]       Vivi Barrack, MD 02/05/2015, 7:00 PM PGY-2, [place]

## 2015-02-05 NOTE — Consult Note (Signed)
Consultation Note Date: 02/05/2015   Patient Name: [name]  DOB: January 23, 1951  MRN: [id number]  Age / Sex: 64 y.o., male   PCP: Zenia Resides, MD Referring Physician: Leeanne Rio, MD  Reason for Consultation: Establishing goals of care  Palliative Care Assessment and Plan Summary of Established Goals of Care and Medical Treatment Preferences   Clinical Assessment/Narrative: Pt is 64 yo man with unintentional weight l;oss, new and worsening daily HA, memory changes admitted for evaluation. Per CT pt now has new bilateral lung nodules, pleural based masses as well as CNS lesions with midline shift. He has a long h/o tobacco use, COPD, bipolar d/o as well as renal cell ca with nephrectomy 20 years ago. At this point, further work up is pending and Palliative Medicine involved early in this new dx to be an ongoing support to pt through this process. He is also being followed by neurology who has started him on decadron 83m q6 atc He is c/o of a HA when I met him. He is slightly irritable but does talk to me some. He does share that " I want to get back to my movie". He also is getting many phone calls from friends where he is telling them " its bad. Im dying". He tells me hospice and a " nursing home" has already been mentioned to him and "Im not going". States he isn't going to go through a "bunch of stuff just to die anyway". He does state he will go thru with the lung BX but is not sure if he would do chemo or XRT if offered.  He describes his family as only 1 sister with whom he lives for the past year. No other siblings. He is divorced. He has 1 son but has not seen him in years and does not know where he is. He does not describe his sister a a source of support. He talks a lot about Dr. HAndria Framesand values his opinion and is grateful for his support . He wants to leave the hospital and go fishing and [place] to [place] and ex-girlfriend "before I die" He does seem to  understand that he has what is likely lung cancer that is now in his brain  Contacts/Participants in Discussion: Primary Decision Maker: Pt at this point   HCPOA: no  Has one sibling his sister, no spouse, and one son. He would not give me sister's name or number. He does not know how to reach his son  Code Status/Advance Care Planning:  Full Code  Symptom Management:   HA: Pt states he has migraines but these HA's are different and worse. He has been given 1 Vicodin which did help. He has been started on decadron iv 4 mg q6 atc. Agree with continuation of this plan. I did tell him to tell his doctor if he sees that he is more anxious, irritable or cannot sleep as that could be a SE of steroids.  Bipolar D/O: Cont Seroquel 400 mg qHS. Monitor for decreased sleep, worsening irritability with steroids on board.    Additional Recommendations (Limitations, Scope, Preferences):  Need to pursue HCPOA option going forward Psycho-social/Spiritual:   Support System: Limited family locally but seems to have many freinds  Desire for further Chaplaincy support:no  Prognosis: < 6 months  Discharge Planning: Undecided at this point       Chief Complaint/History of Present Illness: Pt ia a 64yo man admitted with unintentional weight  loss, daily severe HA's, confusion. Per CT now with new lung nodules as well as CNS lesions causing a midline shift. He states he has been noticing this for about 6 months  Primary Diagnoses  Present on Admission:  . Shortness of breath  Palliative Review of Systems: Pt reporting a HA. Denies dyspnea, n/v I have reviewed the medical record, interviewed the patient and family, and examined the patient. The following aspects are pertinent.  History reviewed. No pertinent past medical history. History   Social History  . Marital Status: Divorced    Spouse Name: N/A  . Number of Children: N/A  . Years of Education: N/A   Social History Main Topics  .  Smoking status: Current Every Day Smoker -- 1.00 packs/day  . Smokeless tobacco: Not on file     Comment: wants to discuss today  . Alcohol Use: Yes  . Drug Use: Yes     Comment: Former user  . Sexual Activity: Not Currently   Other Topics Concern  . None   Social History Narrative   History reviewed. No pertinent family history. Scheduled Meds: . budesonide  1 mg Nebulization BID  . dexamethasone  4 mg Intravenous 4 times per day  . feeding supplement (ENSURE ENLIVE)  237 mL Oral Q1500  . iohexol  25 mL Oral Q1 Hr x 2  . QUEtiapine  400 mg Oral QHS  . sodium chloride  3 mL Intravenous Q12H   Continuous Infusions: . sodium chloride 10 mL/hr at 02/03/15 2234   PRN Meds:.sodium chloride, acetaminophen **OR** acetaminophen, albuterol, HYDROcodone-acetaminophen, sodium chloride Medications Prior to Admission:  Prior to Admission medications   Medication Sig Start Date End Date Taking? Authorizing Provider  QUEtiapine (SEROQUEL) 400 MG tablet TAKE 1 TABLET (400 MG TOTAL) BY MOUTH 2 (TWO) TIMES DAILY. Patient taking differently: TAKE 1 TABLETdaily at bedtime 11/25/14  Yes Zenia Resides, MD  albuterol (VENTOLIN HFA) 108 (90 BASE) MCG/ACT inhaler Inhale 2 puffs into the lungs every 4 (four) hours as needed. For shortness of breath or wheezing. 10/16/11   Zenia Resides, MD   Allergies  Allergen Reactions  . Codeine Nausea Only   CBC:    Component Value Date/Time   WBC 8.6 02/05/2015 0524   HGB 13.5 02/05/2015 0524   HCT 40.5 02/05/2015 0524   PLT 471* 02/05/2015 0524   MCV 87.5 02/05/2015 0524   NEUTROABS 6.9 01/26/2007 1600   LYMPHSABS 1.1 01/26/2007 1600   MONOABS 0.5 01/26/2007 1600   EOSABS 0.1 01/26/2007 1600   BASOSABS 0.0 01/26/2007 1600   Comprehensive Metabolic Panel:    Component Value Date/Time   NA 135 02/05/2015 0524   K 4.5 02/05/2015 0524   CL 98* 02/05/2015 0524   CO2 28 02/05/2015 0524   BUN 8 02/05/2015 0524   CREATININE 0.78 02/05/2015 0524    CREATININE 0.87 01/27/2015 0922   GLUCOSE 163* 02/05/2015 0524   CALCIUM 9.8 02/05/2015 0524   AST 10* 02/04/2015 1400   ALT 12* 02/04/2015 1400   ALKPHOS 112 02/04/2015 1400   BILITOT 0.6 02/04/2015 1400   PROT 6.8 02/04/2015 1400   ALBUMIN 2.7* 02/04/2015 1400    Physical Exam: Vital Signs: BP 131/62 mmHg  Pulse 66  Temp(Src) 97.5 F (36.4 C) (Oral)  Resp 20  Ht _0  (1.702 m)  Wt 65.953 kg (145 lb 6.4 oz)  BMI 22.77 kg/m2  SpO2 100% SpO2: SpO2: 100 % O2 Device: O2 Device: Not Delivered O2 Flow Rate:  Intake/output summary:  Intake/Output Summary (Last 24 hours) at 02/05/15 1107 Last data filed at 02/05/15 1027  Gross per 24 hour  Intake   1920 ml  Output    600 ml  Net   1320 ml   LBM: Last BM Date: 02/04/15 Baseline Weight: Weight: 64.1 kg (141 lb 5 oz) Most recent weight: Weight: 65.953 kg (145 lb 6.4 oz)  Exam Findings:  General: Older man in no acute distress Resp: No work of breathing observed. Clear Cardiac: RRR; no edema Musculoskeletal: MAE x 4 Psych: Poor eye contact. Moos irritable. Affect irritable. Thought processes organized. No a/v, or delusions         Palliative Performance Scale: 70%              Additional Data Reviewed: Recent Labs     02/04/15  1400  02/05/15  0524  WBC  7.4  8.6  HGB  13.5  13.5  PLT  427*  471*  NA  134*  135  BUN  7  8  CREATININE  0.85  0.78     Time In: 1700 Time Out: 1815 Time Total: 75 min Greater than 50%  of this time was spent counseling and coordinating care related to the above assessment and plan. Called teaching service and obtained Vicodin order for 5/325 q 4 prn  Signed by: Dory Horn, NP  Dory Horn, NP  02/05/2015, 11:07 AM  Please contact Palliative Medicine Team phone at [phone] for questions and concerns.

## 2015-02-05 NOTE — Progress Notes (Signed)
Family Medicine Teaching Service Daily Progress Note Intern Pager: [phone]  Patient name: [name] Medical record number: [id number] Date of birth: [birth year] Age: 64 y.o. Gender: male  Primary Care Provider: Zigmund Gottron, MD Consultants: Neuro  Code Status: FULL   Pt Overview and Major Events to Date:  7/29: admitted for brain mets, given decadron 4mg .  7/30: continue decadron per neuro,   Assessment and Plan: REQUAN HARDGE is a 64 y.o. male presenting with CT head suggestive for brain metastasis and lung massess. PMH is significant for RCC s/p left nephrectomy 20 yrs ago, tobacco use, COPD, cervical spine d/o, BPD  Brain and Lung masses: Most likely mets from RCC vs other primary CA. Several month h/o amnesia/headache/unintentional weight loss with a h/o smoking 1ppd. Strong family h/o lung and brain cancer. CT with multiple peripherally enhancing lesions with midline shift most likely metastasis from RCC vs lung CA given smoking history. No focal neurologic signs. No fevers, chills, or leukocytosis concerning for infectious etiology. CBC and BMP WNL. s/p Decadron 4 mg on admission for midline shift.  - continue with decadron 4mg  q6hrs - Neuro consulted, appreciate recs - Tylenol 650 mg qid prn - Consider CT ab/pelvis today for further evaluation  - Pulm c/s this AM for possible lung tissue diagnosis (they will drop short note on who to contact for sample: CVTS vs IR vs bronch) - consider c/s neurosurgery and oncology  Dyspnea: Likely multifactorial from lung masses, plural effusion, and COPD: CT chest: Extensive nodular pleural masses over the left mid to lower thorax worse along the diaphragmatic border with small left pleural effusion. 3.2 cm pleural mass over the posterior left upper thorax. Multiple bilateral pulmonary nodules with the largest measuring 1.1 cm over the right lower lobe. > 50 pack yr history. No wheeze or crackle on exam. Currently satting 98%  on RA.   - Pulmicort BID - albuterol prn  Hip/thigh cramps: intermittent after walking a quarter of mile. Resolves with stopping for about 5 minutes. Possible Leriche syndrome. Has hx of smoking. Can't rule out mets to his spine. Could be simple arthritic changes as well. No pain currently but has been bed bound.  - Consider Vascular dopplers in the future, inpatient vs outpatient.   BPD: Stable on Seroquel 400mg  bid at home - continue home seroquel  FEN/GI: Saline lock, Regular diet Prophylaxis: SCDs (hold pharmacologics pending potential biopsy)  Disposition: Pending further work up.  Subjective:  Patient doing well. Headaches resolved with decadron and PRN hydrocodone. Pt initially states he would not want chemo/radiation, however when asked what a tissue sample would provide other than information if he's not going to undergo chemo or radiation, he states he'd look at the remission rate and would consider these treatments in the future.  Objective: Temp:  [97.5 F (36.4 C)-98 F (36.7 C)] 97.5 F (36.4 C) (07/30 2147) Pulse Rate:  [62-67] 62 (07/30 2147) Resp:  [16-18] 18 (07/30 2147) BP: (93-142)/(66-75) 142/75 mmHg (07/30 2147) SpO2:  [93 %-98 %] 98 % (07/30 2218) Physical Exam: General: Lying in bed in NAD.  Eyes: Conjunctivae non-injected.  ENTM: Moist mucous membranes. Oropharynx clear. No nasal discharge.  Neck: Supple, no LAD Cardiovascular: RRR. No murmurs, rubs, or gallops noted. No pitting edema noted. Respiratory: No increased WOB. CTAB without wheezing, rhonchi, or crackles noted. Abdomen: +BS, soft, non-distended, non-tender.  MSK: Normal bulk and noted. No gross deformities noted.  Neuro: A&O x4. Speech clear. PERRL, EOMI, facial movements symmetric. Tongue/uvula midline.  Unable to assess Babinski as pt is very ticklish.   Psych:  Appropriate mood and affect.   Laboratory:  Recent Labs Lab 02/03/15 1958 02/04/15 1400  WBC 4.8 7.4  HGB 12.3* 13.5  HCT  37.0* 40.5  PLT 404* 427*    Recent Labs Lab 02/03/15 1958 02/04/15 1400  NA  --  134*  K  --  3.9  CL  --  100*  CO2  --  25  BUN  --  7  CREATININE 0.94 0.85  CALCIUM  --  9.6  PROT  --  6.8  BILITOT  --  0.6  ALKPHOS  --  112  ALT  --  12*  AST  --  10*  GLUCOSE  --  110*   UDS: +opiates   EKG: NSR. Twave inversion avR (present prior), some asymmetric tall T waves most like normal variant. Qtc 436  Imaging/Diagnostic Tests: Ct Head W Wo Contrast  02/03/2015   CLINICAL DATA:  Weight loss. Long-term smoker. Abnormal chest x-ray. Multiple pulmonary nodules. Memory loss.  EXAM: CT HEAD WITHOUT AND WITH CONTRAST  TECHNIQUE: Contiguous axial images were obtained from the base of the skull through the vertex without and with intravenous contrast  CONTRAST:  100 mL Omnipaque 300  COMPARISON:  CT head without contrast 02/06/2004.  FINDINGS: A peripherally enhancing mass lesion within the the parietal and occipital lobe exhibits a hyperdense rim on the precontrast images. The lesion measures 3.3 x 2.6 cm in axial dimension.  Additional punctate focus of enhancement is seen more anteriorly within the right temporal lobe on image 9 of series 10.  Extensive vasogenic edema is present in the right temporal, parietal, occipital lobe with effacement of the sulci. Is effacement of the atrium of the right lateral ventricle. Midline shift measures 4 mm at foramen of Monro. Enhancing lesion within the left cerebellar hemisphere measures 10 x 6 mm. Enhancing lead shin at the left IAC a measures 11 x 16 mm, characteristic of a vestibular schwannoma.  A focal soft tissue lesion in the anterior nasal cavity likely represents a polyp. The should be amenable to direct visualization. The paranasal sinuses and mastoid air cells are clear. The calvarium is intact.  IMPRESSION: 1. 3.3 x 2.6 cm peripherally enhancing mass lesion in the right parietal and occipital lobe with significant surrounding vasogenic edema  and mass effect in the right temporal, parietal, and occipital lobe. 2. Midline shift measures 4 mm. 3. Left cerebellar lesion measures 10 x 6 mm. 4. Punctate focus of enhancement in the right temporal lobe likely represents an additional metastasis. 5. 11 x 16 mm enhancing soft tissue lesion at the left IAC is characteristic of the vestibular schwannoma. 6. Probable right-sided nasal polyp. This area should be amenable to direct visualization. These results were called by telephone at the time of interpretation on 02/03/2015 at 3:53 pm to Dr. Madison Hickman , who verbally acknowledged these results.   Electronically Signed   By: San Morelle M.D.   On: 02/03/2015 16:15   Ct Chest W Contrast  02/03/2015   CLINICAL DATA:  Weight loss. Long-term smoker. Abnormal chest x-ray. Productive cough. History of renal cell carcinoma.  EXAM: CT CHEST WITH CONTRAST  TECHNIQUE: Multidetector CT imaging of the chest was performed during intravenous contrast administration.  CONTRAST:  100 mL Omnipaque 300 IV.  COMPARISON:  03/23/2007 and 03/19/2007 as well as chest x-ray 01/27/2015 and abdominal CT 05/04/2010  FINDINGS: Lungs are adequately inflated and demonstrate a  small left pleural effusion with multiple enhancing pleural masses over the left lower thorax most prominent in the lower thorax/lung base adjacent the diaphragm. 3.2 cm enhancing pleural mass over the posterior left upper thorax. These findings are new since 2011 and are concerning for metastatic disease in this patient with history of renal cell carcinoma. There are multiple small bilateral pulmonary parenchymal nodules with the largest measuring 1.1 cm over the right lower lobe compatible with metastatic disease. Surgical suture line over the right apex unchanged. Mild bullous emphysematous changes in the apices. Very minimal likely aspirated debris within the origin of the right mainstem bronchus.  Heart is normal size. There is a small pericardial  effusion. There is calcified plaque over the left main as well as left anterior descending and lateral circumflex coronary arteries. No significant hilar, mediastinal or axillary adenopathy.  Images through the upper abdomen are unremarkable. Remaining bones soft tissues are within normal.  IMPRESSION: Extensive nodular pleural masses over the left mid to lower thorax worse along the diaphragmatic border with small left pleural effusion. 3.2 cm pleural mass over the posterior left upper thorax. Multiple bilateral pulmonary nodules with the largest measuring 1.1 cm over the right lower lobe. These findings are new since 2011 and likely represent metastatic disease in this patient with a known history of renal cell carcinoma.  Small pericardial effusion which is new as cannot exclude a malignant effusion.  Tiny amount of aspirated material over the origin the right mainstem bronchus.  Atherosclerotic coronary artery disease.   Electronically Signed   By: Marin Olp M.D.   On: 02/03/2015 16:01    Archie Patten, MD 02/05/2015, 1:06 AM PGY-2, [place] Intern pager: [phone], text pages welcome

## 2015-02-05 NOTE — Progress Notes (Signed)
02/05/2015 2:27 PM  [name] to be D/C'd Home per MD order.  Discussed prescriptions and follow up appointments with the patient. Prescriptions given to patient, medication list explained in detail. Pt verbalized understanding.    Medication List    TAKE these medications        albuterol 108 (90 BASE) MCG/ACT inhaler  Commonly known as:  VENTOLIN HFA  Inhale 2 puffs into the lungs every 4 (four) hours as needed. For shortness of breath or wheezing.     dexamethasone 4 MG tablet  Commonly known as:  DECADRON  Take 1 tablet (4 mg total) by mouth 2 (two) times daily with a meal.     HYDROcodone-acetaminophen 5-325 MG per tablet  Commonly known as:  NORCO/VICODIN  Take 1 tablet by mouth every 4 (four) hours as needed for moderate pain.     QUEtiapine 400 MG tablet  Commonly known as:  SEROQUEL  TAKE 1 TABLET (400 MG TOTAL) BY MOUTH 2 (TWO) TIMES DAILY.        Filed Vitals:   02/05/15 1026  BP: 131/62  Pulse: 66  Temp: 97.5 F (36.4 C)  Resp: 20    Skin clean, dry and intact without evidence of skin break down, no evidence of skin tears noted. IV catheter discontinued intact. Site without signs and symptoms of complications. Dressing and pressure applied. Pt denies pain at this time. No complaints noted.  An After Visit Summary was printed and given to the patient. Patient escorted via family and walked out, and D/C home via private auto.  Retta Mac BSN, RN

## 2015-02-06 ENCOUNTER — Telehealth: Payer: Self-pay | Admitting: Family Medicine

## 2015-02-06 DIAGNOSIS — C7931 Secondary malignant neoplasm of brain: Secondary | ICD-10-CM | POA: Insufficient documentation

## 2015-02-06 DIAGNOSIS — D381 Neoplasm of uncertain behavior of trachea, bronchus and lung: Secondary | ICD-10-CM

## 2015-02-06 NOTE — Telephone Encounter (Signed)
Would like to referral to hospice. Would like help in his last days Was discharged from hospital yesterday

## 2015-02-06 NOTE — Telephone Encounter (Signed)
Referral request is appropriate.  Order entered.

## 2015-02-06 NOTE — Assessment & Plan Note (Signed)
CT shows an obvious malignant lesion with surrounding edema.  We are treating with decadron.  He has chosen to forgo biopsy, chemo and radiation.  Wants comfort care.  Hospice referral is appropriate.

## 2015-02-07 ENCOUNTER — Encounter: Payer: Self-pay | Admitting: Clinical

## 2015-02-07 NOTE — Progress Notes (Signed)
Referral for Hospice has been sent to [facility].  Hunt Oris, MSW, [place]

## 2015-02-10 ENCOUNTER — Telehealth: Payer: Self-pay | Admitting: Family Medicine

## 2015-02-10 ENCOUNTER — Ambulatory Visit: Payer: Medicare Other | Admitting: Family Medicine

## 2015-02-10 DIAGNOSIS — C801 Malignant (primary) neoplasm, unspecified: Secondary | ICD-10-CM

## 2015-02-10 NOTE — Telephone Encounter (Signed)
[name] calling and would like to know where it might be possible to go for a second opinion regarding his cancer. Please advise. Thank you, [name], ASA

## 2015-02-13 ENCOUNTER — Telehealth: Payer: Self-pay | Admitting: Obstetrics and Gynecology

## 2015-02-13 ENCOUNTER — Telehealth: Payer: Self-pay | Admitting: Family Medicine

## 2015-02-13 MED ORDER — MORPHINE SULFATE ER 15 MG PO TBCR: 15.0000 mg | EXTENDED_RELEASE_TABLET | Freq: Two times a day (BID) | ORAL | Status: DC

## 2015-02-13 MED ORDER — DEXAMETHASONE 4 MG PO TABS: 4.0000 mg | ORAL_TABLET | Freq: Two times a day (BID) | ORAL | Status: AC

## 2015-02-13 NOTE — Telephone Encounter (Signed)
Refilled decadron

## 2015-02-13 NOTE — Telephone Encounter (Addendum)
Family Medicine Emergency Line Telephone Note  Received call from patient's hospice nurse caregiver, Lattie Haw, that patient was complaining of severe 9/10 pain. She was with patient at his home. Pain located in abdomen and is diffuse. Described as aching and throbbing pain. Has had 5-6 tabs of Norco today that is not helping pain. Also had two .62mL morphine out of comfort pack. Morphine helps give miniminal relief for 1-2 hrs.  Nurse caregiver wondering if there is any other medications we could do for patient's pain to give overnight relief. She suggested maybe some kind of baseline long-term pain medication such as fentanyl patch. She also recommended possible dose increase of Norco.   For overnight relief Rx faxed to pharmacy for MS Contin. Will forward note to PCP Dr. Andria Frames to adjust pain control as he feel appropriate.  Patient receives hospice care with [facility].    Caregiver voiced understanding and agreed to plan.   Luiz Blare, DO 02/13/2015, 7:02 PM PGY-2, [place]

## 2015-02-13 NOTE — Telephone Encounter (Signed)
Pt called because he was given a prescription for steroids to help with the swelling in his brain. He is now out of this and is not sure if he is suppose to get a refill or stop taking when medication runs out. Please call patient to discuss. jw

## 2015-02-13 NOTE — Assessment & Plan Note (Signed)
Likely lung or pancreatic cancer.  See both abd and chest CT

## 2015-02-13 NOTE — Telephone Encounter (Signed)
Told any second opinion would need to start with a biopsy.  He said that he does not now want a second opinion.

## 2015-02-14 ENCOUNTER — Telehealth: Payer: Self-pay | Admitting: Family Medicine

## 2015-02-14 MED ORDER — HYDROCODONE-ACETAMINOPHEN 5-325 MG PO TABS: 1.0000 | ORAL_TABLET | ORAL | Status: DC | PRN

## 2015-02-14 MED ORDER — MORPHINE SULFATE ER 30 MG PO TBCR: 30.0000 mg | EXTENDED_RELEASE_TABLET | Freq: Two times a day (BID) | ORAL | Status: DC

## 2015-02-14 NOTE — Telephone Encounter (Signed)
RX faxed and mailed per Dr. Andria Frames. Katharina Caper, April D, [place]

## 2015-02-14 NOTE — Telephone Encounter (Signed)
Faxed and mailed RX given to me today from Dr. Andria Frames to [facility] in [place], [place]. Katharina Caper, April D, [place]

## 2015-02-14 NOTE — Telephone Encounter (Signed)
Pt called to speak to Dr. Andria Frames. He said that his medications are not at the pharmacy and wanted to know how much longer it was going to be. [name]

## 2015-02-14 NOTE — Telephone Encounter (Signed)
Call from patient and hospice nurse about poorly controled pain.  Will increase both hydrocodone and MS contin

## 2015-02-14 NOTE — Telephone Encounter (Signed)
Lisa from hospice called and would like to know if the doctor could call in the Fentanyl patches instead of the Morphine tablets the patient feels that this is easier option for him. [name]

## 2015-02-17 ENCOUNTER — Ambulatory Visit: Payer: Self-pay | Admitting: Family Medicine

## 2015-02-20 NOTE — Telephone Encounter (Signed)
He just wanted to touch base with me about how he was feeling.

## 2015-02-20 NOTE — Telephone Encounter (Signed)
Please call [name].  Need to speak with you.

## 2015-02-23 ENCOUNTER — Telehealth: Payer: Self-pay | Admitting: Family Medicine

## 2015-02-23 MED ORDER — HYDROCODONE-ACETAMINOPHEN 5-325 MG PO TABS: 1.0000 | ORAL_TABLET | Freq: Four times a day (QID) | ORAL | Status: DC | PRN

## 2015-02-23 MED ORDER — MORPHINE SULFATE ER 15 MG PO TBCR: 15.0000 mg | EXTENDED_RELEASE_TABLET | Freq: Two times a day (BID) | ORAL | Status: DC

## 2015-02-23 NOTE — Telephone Encounter (Signed)
Call from hospice nurse.  She found [name] to be quite sedated.  He has gone through almost a full bottle of his vicodin in one week and he is taking more MS contin than he should.  This is not surprising with his history of drug abuse.  We problem solved for this logistically difficult situation.  I will decrease the MS contin.  Rewrite the vicodin.  Hospice nurse will only give a small quantity at a time to avoid misuse.  Fax prescriptions to  [phone]

## 2015-02-28 ENCOUNTER — Telehealth: Payer: Self-pay | Admitting: Family Medicine

## 2015-02-28 MED ORDER — POLYETHYLENE GLYCOL 3350 17 GM/SCOOP PO POWD: 17.0000 g | Freq: Two times a day (BID) | ORAL | Status: AC | PRN

## 2015-02-28 NOTE — Telephone Encounter (Signed)
Called and will use miralax.

## 2015-02-28 NOTE — Telephone Encounter (Signed)
Megan from Hospice of calling on behalf of pt, Pt is experiencing constipation for last "5 days", he was given a depository this morning. Pt states he was able to pass a small amount of stool but "not enough", Asking if it is OK to try stool softener 1-4 times a day or to see what PCP would recommend. Thanks, [employer], ASA

## 2015-03-03 ENCOUNTER — Telehealth: Payer: Self-pay | Admitting: Family Medicine

## 2015-03-03 NOTE — Telephone Encounter (Signed)
Jinny Blossom is the home health nurse for the patient and really needs to speak to the doctor about the patient pain. jw

## 2015-03-06 MED ORDER — FENTANYL 50 MCG/HR TD PT72: 50.0000 ug | MEDICATED_PATCH | TRANSDERMAL | Status: DC

## 2015-03-06 NOTE — Telephone Encounter (Signed)
Calling again to see if PCP would want to try a sentanyl patch instead of morphine. Please contact Megan at the earliest convenience at [phone]. Sadie Reynolds, ASA

## 2015-03-06 NOTE — Telephone Encounter (Signed)
[phone] fax Called and discussed.  Agree to DC MS Contin and use Fentanyl patch.  Likely better compliance, less chance of abuse.

## 2015-03-07 ENCOUNTER — Telehealth: Payer: Self-pay | Admitting: Family Medicine

## 2015-03-07 MED ORDER — FENTANYL 50 MCG/HR TD PT72: 50.0000 ug | MEDICATED_PATCH | TRANSDERMAL | Status: DC

## 2015-03-07 NOTE — Telephone Encounter (Signed)
Will resend.  This rx was faxed yesterday afternoon.

## 2015-03-07 NOTE — Telephone Encounter (Signed)
Needs RX for fentanyl patch faxed [phone]-fax

## 2015-03-20 ENCOUNTER — Telehealth: Payer: Self-pay | Admitting: Family Medicine

## 2015-03-20 NOTE — Telephone Encounter (Signed)
RN with hospice is asking for a change in patients meds. Wants to know if MD will increase fentanyl patches to 75 mgs/ hour, also wants to change hydrocodone to morphine IR 15 mg/ 4 hours as needed for pain. Patient has went through 60 hydrocodone since last Wednesday and she is worried about all the tylenol. May fax to [phone], call with questions.

## 2015-03-21 MED ORDER — HYDROCODONE-ACETAMINOPHEN 5-325 MG PO TABS: 1.0000 | ORAL_TABLET | Freq: Four times a day (QID) | ORAL | Status: DC | PRN

## 2015-03-21 MED ORDER — FENTANYL 75 MCG/HR TD PT72: 75.0000 ug | MEDICATED_PATCH | TRANSDERMAL | Status: AC

## 2015-03-21 NOTE — Telephone Encounter (Signed)
Pt called and wanted to know when someone was going to call him. [name]
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
## 2015-03-21 NOTE — Telephone Encounter (Signed)
Patient called this morning stating he has been having chest pain for three days now and also coughing up blood.  He also mentioned that Hospice changed his pain medication from morphine to hydrocodone now back to morphine.  He called hospice and told them about coughing up blood they was going to send in cough medication. He is assisting to speak with his PCP.  Will forward message to PCP.  Martin Barrow, RN

## 2015-03-24 ENCOUNTER — Other Ambulatory Visit: Payer: Self-pay | Admitting: Family Medicine

## 2015-03-24 DIAGNOSIS — Z85528 Personal history of other malignant neoplasm of kidney: Secondary | ICD-10-CM

## 2015-03-24 MED ORDER — HYDROCOD POLST-CPM POLST ER 10-8 MG/5ML PO SUER: 5.0000 mL | Freq: Two times a day (BID) | ORAL | Status: AC | PRN

## 2015-03-24 MED ORDER — ONDANSETRON 4 MG PO TBDP: 4.0000 mg | ORAL_TABLET | Freq: Four times a day (QID) | ORAL | Status: AC | PRN

## 2015-03-24 MED ORDER — HYDROCOD POLST-CPM POLST ER 10-8 MG/5ML PO SUER: 5.0000 mL | Freq: Two times a day (BID) | ORAL | Status: DC | PRN

## 2015-03-24 NOTE — Telephone Encounter (Signed)
Pt calling again concerning his medication, says the hydrocodone is making him really sick, wants to know if he can change to something else.

## 2015-03-24 NOTE — Telephone Encounter (Signed)
Spoke with patient. He endorses nausea x 5 days.  States that he stopped taking his morphine because he thought that was making him sick.  Asked if he has any nausea medication but he does not.  MD paged, but is unavaliable.   Spoke with Dr. McDiarmid who is agreeable to giving him Zofran.  Rx was sent to his pharmacy.  Pt informed and agreeable.  Is to call Monday if he is not feeling better. [place], Salome Spotted

## 2015-03-24 NOTE — Progress Notes (Signed)
Patient reporting nausea while taking Norco 5/325.  Prescribing Zofran ODT 4 mg , 1 tab oral every 6 hours for opiate-related nausea.

## 2015-03-24 NOTE — Progress Notes (Signed)
Cough - requested by hospice - ordered twice because Rx did not print first time.

## 2015-04-04 ENCOUNTER — Telehealth: Payer: Self-pay | Admitting: Family Medicine

## 2015-04-04 DIAGNOSIS — C7931 Secondary malignant neoplasm of brain: Secondary | ICD-10-CM

## 2015-04-04 MED ORDER — HYDROCODONE-IBUPROFEN 10-200 MG PO TABS: 1.0000 | ORAL_TABLET | ORAL | Status: DC | PRN

## 2015-04-04 MED ORDER — HYDROCODONE-IBUPROFEN 10-200 MG PO TABS: 1.0000 | ORAL_TABLET | ORAL | Status: AC | PRN

## 2015-04-04 NOTE — Telephone Encounter (Signed)
Spoke to patient.  He strongly prefers hydrocodone - "morphine does not work"  States they have cut him off from the vicodin because of tylenol concerns.  I told him I would talk to the hospice nurse.  Spoke with [employer].  Despite limits to not take more than 8 vicodin in 24 hours, he took 34 tabs in approximately 36 hours.  We both agree that [name] is hard to set limits and tends to do what he wants.  We decided on hydrocodone only rescue meds

## 2015-04-04 NOTE — Telephone Encounter (Signed)
Pt is extremely frustrated and seems to be in tears as he explains to me that his nurse at hospice is telling him one thing and giving him something different. He states that they are giving him steroids instead of his anxiety or pain medication. He states that she promises him that she'll help take care of his pain and work with the doctor's office to get him pain medication without Tylenol but she isn't following through with her promises. He doesn't know what to do and seems very stressed about this and is practically begging for Dr. Andria Frames to call him back as soon as he can. Thank you, [name], ASA

## 2015-04-04 NOTE — Telephone Encounter (Signed)
Rx did not print first time.  Did second time and fax Rx to [phone]

## 2015-04-05 ENCOUNTER — Telehealth: Payer: Self-pay | Admitting: Family Medicine

## 2015-04-05 NOTE — Telephone Encounter (Signed)
[name] called because she will not be able to get the patient Pain medication to him until tomorrow. She wanted the doctor to know. If you have any questions please call her at [phone]. [name]

## 2015-04-25 ENCOUNTER — Telehealth: Payer: Self-pay | Admitting: Family Medicine

## 2015-04-25 NOTE — Telephone Encounter (Signed)
Called Sharlene from [facility]. LM on VM. Ottis Stain, CMA

## 2015-04-25 NOTE — Telephone Encounter (Signed)
Sharlene from [facility] [employer] called and would like to speak to one of the nurses for Dr. Andria Frames to clarify some information she is getting from the patient. Please call her at [phone] select Opt #1 then extension [phone]. jw

## 2015-04-25 NOTE — Telephone Encounter (Signed)
Spoke to Te[place]. [name] was unsure if he was under Hospice care or not. I was only able to tell her that an referral/order had been placed. Ottis Stain, CMA

## 2015-04-27 NOTE — Telephone Encounter (Signed)
[phone] [phone] Increase ativan to 0.5 mg q6h to 1 mg q6h For some reason, the fentanyl is still at 50 micrograms.  I reiterated to increase to 75 micrograms daily. I should expect a fax soon.

## 2015-04-27 NOTE — Telephone Encounter (Signed)
[name] is calling back and wanted to know what to do next. She said that the patient is not eating and should she change any dosage? Please call her at [phone]. [name]

## 2015-04-30 ENCOUNTER — Emergency Department (HOSPITAL_COMMUNITY)
Admission: EM | Admit: 2015-04-30 | Discharge: 2015-05-01 | Disposition: A | Discharge status: Home / Self Care | Payer: Medicare Other | Attending: Emergency Medicine | Admitting: Emergency Medicine

## 2015-04-30 ENCOUNTER — Emergency Department (HOSPITAL_COMMUNITY): Payer: Medicare Other

## 2015-04-30 ENCOUNTER — Encounter (HOSPITAL_COMMUNITY): Payer: Self-pay | Admitting: Vascular Surgery

## 2015-04-30 DIAGNOSIS — R197 Diarrhea, unspecified: Secondary | ICD-10-CM | POA: Diagnosis not present

## 2015-04-30 DIAGNOSIS — Z720 Tobacco use: Secondary | ICD-10-CM | POA: Insufficient documentation

## 2015-04-30 DIAGNOSIS — Z859 Personal history of malignant neoplasm, unspecified: Secondary | ICD-10-CM | POA: Insufficient documentation

## 2015-04-30 DIAGNOSIS — R112 Nausea with vomiting, unspecified: Secondary | ICD-10-CM | POA: Diagnosis not present

## 2015-04-30 DIAGNOSIS — R079 Chest pain, unspecified: Secondary | ICD-10-CM | POA: Diagnosis not present

## 2015-04-30 DIAGNOSIS — C7800 Secondary malignant neoplasm of unspecified lung: Secondary | ICD-10-CM | POA: Insufficient documentation

## 2015-04-30 DIAGNOSIS — R000 Tachycardia, unspecified: Secondary | ICD-10-CM | POA: Insufficient documentation

## 2015-04-30 DIAGNOSIS — C7931 Secondary malignant neoplasm of brain: Secondary | ICD-10-CM | POA: Insufficient documentation

## 2015-04-30 DIAGNOSIS — Z79899 Other long term (current) drug therapy: Secondary | ICD-10-CM | POA: Insufficient documentation

## 2015-04-30 LAB — BASIC METABOLIC PANEL
ANION GAP: 10 (ref 5–15)
BUN: 7 mg/dL (ref 6–20)
CO2: 28 mmol/L (ref 22–32)
Calcium: 10.9 mg/dL — ABNORMAL HIGH (ref 8.9–10.3)
Chloride: 97 mmol/L — ABNORMAL LOW (ref 101–111)
Creatinine, Ser: 0.77 mg/dL (ref 0.61–1.24)
GFR calc Af Amer: >60 mL/min (ref >60)
GFR calc non Af Amer: >60 mL/min (ref >60)
GLUCOSE: 110 mg/dL — AB (ref 65–99)
POTASSIUM: 3.8 mmol/L (ref 3.5–5.1)
Sodium: 135 mmol/L (ref 135–145)

## 2015-04-30 LAB — CBC
HCT: 37.2 % — ABNORMAL LOW (ref 39.0–52.0)
HEMOGLOBIN: 12.2 g/dL — AB (ref 13.0–17.0)
MCH: 29.4 pg (ref 26.0–34.0)
MCHC: 32.8 g/dL (ref 30.0–36.0)
MCV: 89.6 fL (ref 78.0–100.0)
Platelets: 344 10*3/uL (ref 150–400)
RBC: 4.15 MIL/uL — AB (ref 4.22–5.81)
RDW: 15.7 % — ABNORMAL HIGH (ref 11.5–15.5)
WBC: 6.4 10*3/uL (ref 4.0–10.5)

## 2015-04-30 LAB — I-STAT TROPONIN, ED: Troponin i, poc: 0.02 ng/mL (ref 0.00–0.08)

## 2015-04-30 MED ORDER — SODIUM CHLORIDE 0.9 % IV BOLUS (SEPSIS)
1000.0000 mL | Freq: Once | INTRAVENOUS | Status: AC
  Administered 2015-04-30: 1000 mL via INTRAVENOUS

## 2015-04-30 MED ORDER — HYDROCODONE-IBUPROFEN 10-200 MG PO TABS: 1.0000 | ORAL_TABLET | ORAL | Status: DC | PRN

## 2015-04-30 NOTE — ED Notes (Signed)
Pt reports to the ED for eval of CP x 1 month. Pt reports he was told several months ago that he has cancer all over. He reports he has been coughing up blood. Reports he is hurting all over. Pt reports some SOB and lightheadedness. Pt is under hospice and was seen by them approx 1 week ago. Pt A&Ox4, resp e/u, and skin warm and dry.

## 2015-04-30 NOTE — Discharge Instructions (Signed)
Metastatic Cancer Metastatic cancer is cancer that has spread from the place where it started (primary site) to another part of the body. The process of cancer spreading from the primary site is called metastasis. When cancer cells metastasize, they do not change the way they look or the way they affect the body. Primary lung cancer that spreads to the brain is metastatic lung cancer, not brain cancer. Cancer cells can spread:  Directly from one part of the body to a nearby area (local invasion).  Into a lymph vessel and be carried through the lymph system to lymph nodes and other parts of the body. The lymph system is a network of vessels and nodes that carry fluid throughout the body and help to protect against infections.  Into the blood vessels and be carried to other parts of the body through the bloodstream. WHAT TYPES OF CANCER CAN SPREAD? All types of cancer can spread. Some cancers are more likely to metastasize than others. The most common places that cancers metastasize to are:  Bones.  Liver.  Lungs. Cancers that are more advanced when they are diagnosed and treated are more likely to metastasize. Some primary cancers are more likely to metastasize to a specific part of the body. For example:  Breast cancer may spread to:  Bones.  Brain.  Liver.  Lungs.  Lung cancer may spread to:  Bones.  Brain.  Liver.  Adrenal gland.  Other lung.  Melanoma skin cancer may spread to:  Bones.  Brain.  Liver.  Lungs.  Muscles.  Prostate cancer may spread to:  Bones.  Liver.  Lungs.  Adrenal gland.  Colon cancer may spread to:  Tissue that lines the abdominal wall and covers most of the abdominal organs.  Liver.  Lungs. WHAT ARE THE RISKS FOR METASTATIC CANCER? Your risk for metastatic cancer depends on:  The type of cancer that you have.  The stage and grade of your primary cancer at the time of diagnosis. Tumors are graded by looking at tumor  cells under a microscope. Grading predicts how quickly the tumor cells will grow. Your health care provider will use the stage and the grade of your primary cancer to determine the chances of metastasis. This helps your health care provider to find the best treatment for you. Risk for metastasis may go up with:  A larger primary tumor.  A higher grade of tumor.  Deeper growth of tumor.  Lymph node involvement. HOW IS METASTATIC CANCER DIAGNOSED? Your health care provider may suspect metastatic cancer from your signs and symptoms. Some people do not have any symptoms. Their cancer is found through imaging or other tests.  Symptoms may include:  Weakness.  Lack of energy.  Pain.  Weight loss.  Trouble breathing.  Signs may include:  Fluid buildup in your lungs or belly.  Tumor growths that can be felt or seen.  An enlarged liver. Your health care provider will also do a physical exam. This may include:  Blood tests to check for certain substances that are secreted by tumors (tumor markers).  Tumor markers that increase after treatment can indicate metastasis.  Tumor markers may be used to help diagnose metastasis in colon and prostate cancer.  Not all cancers have tumor markers.  Imaging studies, such as:  X-rays.  Ultrasound.  MRI.  Other imaging tests, such as CT scans, bone scans, and PET scans.  Biopsy.  This involves checking a small piece of tissue from a new cancer site to  see if the cells are similar to cancer cells from the primary site. This can confirm metastatic cancer.  Biopsies may be done by surgically removing a piece of tissue or using a needle to get a tissue sample.  Testing fluid samples from the lungs, spine, or belly for metastatic cancer cells. WHAT ARE THE TREATMENT OPTIONS FOR METASTATIC CANCER? There are many options for treating metastatic cancer. Your treatment will depend on:  The type of cancer that you have.  How far your  cancer has advanced.  Your general health. Treatment may not be able to cure metastatic cancer, but it can often relieve the symptoms. In many cases, you may have a combination of treatments. Options may include:  Surgery.  Cancer-killing drugs (chemotherapy).  X-ray treatment (radiation therapy).  Hormone therapy.  Treatments that help your body to fight cancer (biologic therapy). CAN METASTATIC CANCER BE PREVENTED? The only way to prevent metastatic cancer is to find your primary cancer early and treat it successfully. Talk with your health care provider about cancer screening. Screening exams for early detection are available for some types of cancer, including:  Breast.  Colon.  Prostate.  Lung.  Cervical. HOW CAN I LEARN MORE? The following websites provide more information.  [place] ([place]) http://www.hicks.com/  [place] ([facility]) http://www.cancer.org/treatment/understandingyourdiagnosis/advancedcancer/advanced-cancer-what-is-metastatic   This information is not intended to replace advice given to you by your health care provider. Make sure you discuss any questions you have with your health care provider.   Document Released: 10/29/2004 Document Revised: 07/15/2014 Document Reviewed: 10/07/2013 Elsevier Interactive Patient Education [employer].

## 2015-04-30 NOTE — ED Notes (Signed)
Pt requested a coke to drink and pt was given the same.

## 2015-04-30 NOTE — ED Notes (Signed)
Attempting to contact pt's sister for ride home.  Per Dr. Audie Pinto, pt is ready to be discharged.  Sister left and thought pt would be admitted.  Number for sister doesn't work.  Per pt, sister doesn't have any minutes left on her phone and she will have more tomorrow.

## 2015-04-30 NOTE — ED Notes (Signed)
Pt attempted to call family for transport home. No response, pt left voicemail.

## 2015-04-30 NOTE — ED Notes (Signed)
Pt noted attempting to get out of the bed. Pt assisted back into bed and informed to call for assistance when trying to get up. Pt wants to get a hold of his sister for transport home.

## 2015-04-30 NOTE — ED Provider Notes (Signed)
CSN: [id number]     Arrival date & time 04/30/15  1541 History   First MD Initiated Contact with Patient 04/30/15 1603     Chief Complaint  Patient presents with  . Chest Pain     (Consider location/radiation/quality/duration/timing/severity/associated sxs/prior Treatment) Patient is a 64 y.o. male presenting with chest pain. The history is provided by the patient.  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 He is presenting with hemoptysis. He reports that this is worsening as of late. He reports one episode in the past 24 hours. It is bright red blood and varies in amount. He reports having anorexia as of late as well. His last bowel movement was today and had diarrhea. He denies any sick contacts, fever, or chills. 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 He was placed on hospice care with cold treating for comfort. He reports having some nausea, lightheadedness, and vomiting.  Past Medical History  Diagnosis Date  . Cancer [facility])    History reviewed. No pertinent past surgical history. No family history on file. Social History  Substance Use Topics  . Smoking status: Current Every Day Smoker -- 1.00 packs/day  . Smokeless tobacco: None     Comment: wants to discuss today  . Alcohol Use: Yes    Review of Systems  Constitutional: Negative for fever and chills.  Respiratory: Negative for cough and shortness of breath.   Cardiovascular: Positive for chest pain.  Gastrointestinal: Positive for nausea, vomiting and diarrhea. Negative for abdominal pain and constipation.  Genitourinary: Negative for dysuria.  Skin: Negative for rash.      Allergies  Codeine  Home Medications   Prior to  Admission medications   Medication Sig Start Date End Date Taking? Authorizing Provider  albuterol (VENTOLIN HFA) 108 (90 BASE) MCG/ACT inhaler Inhale 2 puffs into the lungs every 4 (four) hours as needed. For shortness of breath or wheezing. 10/16/11   Zenia Resides, MD  chlorpheniramine-HYDROcodone (TUSSIONEX PENNKINETIC ER) 10-8 MG/5ML SUER Take 5 mLs by mouth every 12 (twelve) hours as needed for cough. 03/24/15   Zenia Resides, MD  dexamethasone (DECADRON) 4 MG tablet Take 1 tablet (4 mg total) by mouth 2 (two) times daily with a meal. Patient not taking: Reported on 04/27/2015 02/13/15   Zenia Resides, MD  fentaNYL (DURAGESIC - DOSED MCG/HR) 75 MCG/HR Place 1 patch (75 mcg total) onto the skin every 3 (three) days. 03/21/15   Zenia Resides, MD  Hydrocodone-Ibuprofen 10-200 MG TABS Take 1-2 tablets by mouth every 4 (four) hours as needed (pain). Do not exceed 12 tabs per day. 04/04/15   Zenia Resides, MD  LORazepam (ATIVAN) 1 MG tablet Take 1 mg by mouth every 6 (six) hours.    Historical Provider, MD  ondansetron (ZOFRAN ODT) 4 MG disintegrating tablet Take 1 tablet (4 mg total) by mouth every 6 (six) hours as needed for nausea or vomiting. 03/24/15   Blane Ohara McDiarmid, MD  polyethylene glycol powder (GLYCOLAX/MIRALAX) powder Take 17 g by mouth 2 (two) times daily as needed. 02/28/15   Zenia Resides, MD  QUEtiapine (SEROQUEL) 400 MG tablet TAKE 1 TABLET (400 MG TOTAL) BY MOUTH 2 (TWO) TIMES  DAILY. Patient taking differently: TAKE 1 TABLETdaily at bedtime 11/25/14   Zenia Resides, MD   BP 120/68 mmHg  Pulse 94  Temp(Src) 97.8 F (36.6 C) (Oral)  Resp 33  SpO2 93% Physical Exam  Constitutional: He is oriented to person, place, and time. He appears well-developed.  HENT:  Head: Normocephalic.  Eyes: Conjunctivae and EOM are normal.  Neck: Normal range of motion. Neck supple.  Cardiovascular: Regular rhythm, normal heart sounds and intact distal pulses.  Tachycardia present.    No murmur heard. Pulmonary/Chest: Effort normal. No respiratory distress. He has decreased breath sounds in the left lower field. He has no wheezes.  Abdominal: Soft. Bowel sounds are normal. He exhibits no distension. There is no tenderness.  Musculoskeletal: Normal range of motion. He exhibits no edema.  Neurological: He is alert and oriented to person, place, and time.  Skin: Skin is warm. No rash noted.    ED Course  Procedures (including critical care time) Labs Review Labs Reviewed  BASIC METABOLIC PANEL - Abnormal; Notable for the following:    Chloride 97 (*)    Glucose, Bld 110 (*)    Calcium 10.9 (*)    All other components within normal limits  CBC - Abnormal; Notable for the following:    RBC 4.15 (*)    Hemoglobin 12.2 (*)    HCT 37.2 (*)    RDW 15.7 (*)    All other components within normal limits  Randolm Idol, ED    Imaging Review Dg Chest 2 View  04/30/2015  CLINICAL DATA:  Chest pain. EXAM: CHEST  2 VIEW COMPARISON:  January 27, 2015. FINDINGS: There is a significant increase in number of pulmonary nodules throughout both lungs consistent with metastatic disease. Stable left basilar pleural-based mass is noted. No pneumothorax is noted. IMPRESSION: Significantly worsening bilateral pulmonary metastatic disease. Stable left basilar pleural-based mass is noted. Electronically Signed   By: Marijo Conception, M.D.   On: 04/30/2015 16:56   I have personally reviewed and evaluated these images and lab results as part of my medical decision-making.   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 Hgb is at baseline.  istat trop is normal. CXR showing worsening b/l pulmonary metastatic disease. Tachycardia improved with fluids.  Patient is resting comfortably while in the ED.  Patient stable for discharge.   Rosemarie Ax, MD PGY-3, [facility] 04/30/2015, 8:53 PM   Rosemarie Ax, MD 04/30/15 2053  Leonard Schwartz, MD 04/30/15 [phone]

## 2015-04-30 NOTE — ED Notes (Signed)
Pt hooked up to the monitor with a 5 lead, BP cuff and pulse ox ?

## 2015-05-01 MED ORDER — IBUPROFEN 400 MG PO TABS
400.0000 mg | ORAL_TABLET | Freq: Once | ORAL | Status: AC
  Administered 2015-05-01: 400 mg via ORAL
  Filled 2015-05-01: qty 1

## 2015-05-01 NOTE — ED Notes (Signed)
Pt is sleeping at this time.

## 2015-05-01 NOTE — ED Notes (Signed)
PT provided with a cab voucher for ride home . Family unable to come pick PT up

## 2015-05-01 NOTE — ED Notes (Signed)
PT reports His sister works in [employer]  At [employer] [phone] . TC to [employer]  To talk with Annabell Howells  . Trying to obtain  PT home address because  The one listed was incorrect. Pt sister did come to speak on the telephone but Sister did not remember the home address . Sister stated she need to go to car to get the address. Will call Sister back to get address.  The Sisters address is [address]. Sister states " My brother does not have a key to the APT."
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
## 2015-05-02 ENCOUNTER — Telehealth: Payer: Self-pay | Admitting: *Deleted

## 2015-05-02 NOTE — Telephone Encounter (Signed)
Well, that is concerning.  I called Megan of [facility] and left message for her to call me.  I wanted to confirm whether they are still following.
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
## 2015-05-08 ENCOUNTER — Telehealth: Payer: Self-pay | Admitting: *Deleted

## 2015-05-08 NOTE — Telephone Encounter (Signed)
Noted  

## 2015-05-08 NOTE — Telephone Encounter (Signed)
Misty Nichols from [facility] called to inform PCP that patient expired yesterday at 2:45 AM.  Please call with questions at [phone].  Derl Barrow, RN

## 2015-05-09 DEATH — deceased

## 2016-02-09 IMAGING — CR DG CHEST 2V
2 series · 2 of 2 positions shown · non-contrast
Comparison: January 27, 2015.

CLINICAL DATA: Chest pain.

EXAM:
CHEST  2 VIEW

[chest lat]
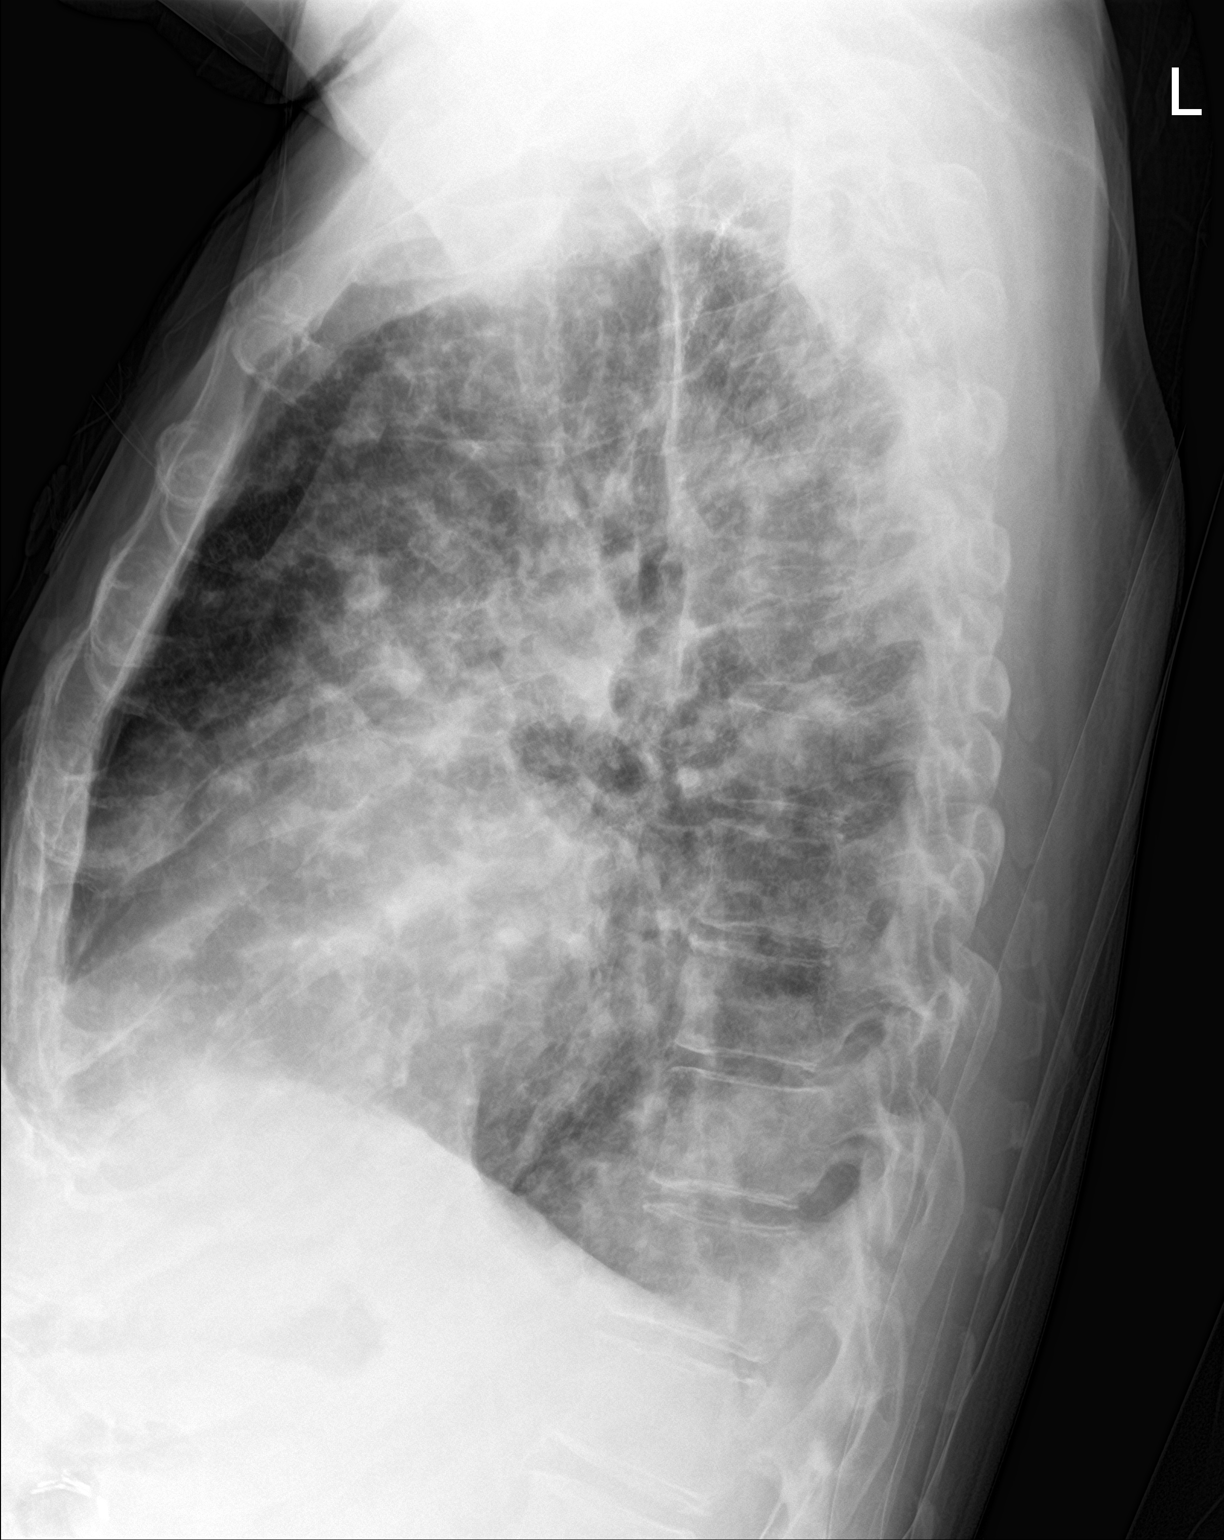

[chest ap]
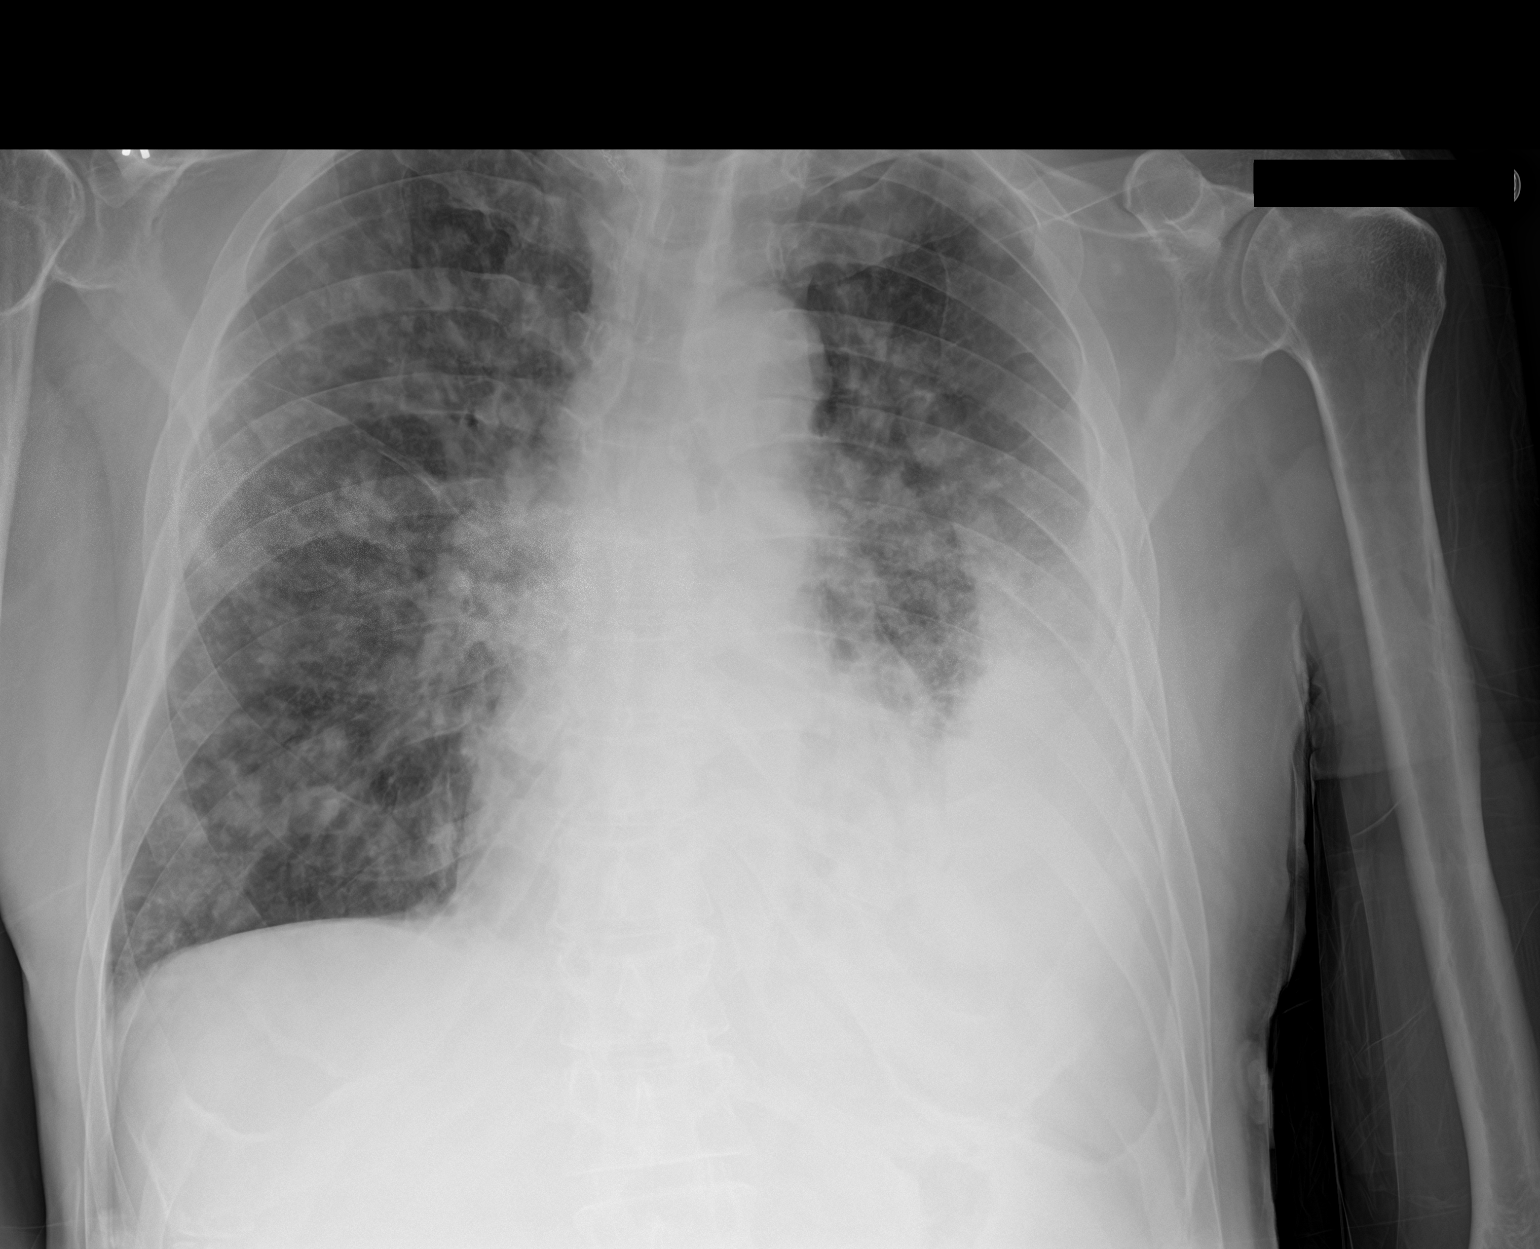

[2 of 2 positions shown; findings below may reference images not displayed]

FINDINGS: There is a significant increase in number of pulmonary nodules
throughout both lungs consistent with metastatic disease. Stable
left basilar pleural-based mass is noted. No pneumothorax is noted.
IMPRESSION: Significantly worsening bilateral pulmonary metastatic disease.
Stable left basilar pleural-based mass is noted.

## 2017-07-21 NOTE — Telephone Encounter (Signed)
Open encounter clean up.[name], CMA
